# Patient Record
Sex: Male | Born: 1960
Health system: Southern US, Community
[De-identification: ages and names within clinical notes are randomized; demographics above are authoritative.]

## PROBLEM LIST (undated history)

## (undated) DIAGNOSIS — D693 Immune thrombocytopenic purpura: Secondary | ICD-10-CM

## (undated) DIAGNOSIS — T7840XA Allergy, unspecified, initial encounter: Secondary | ICD-10-CM

## (undated) DIAGNOSIS — U071 COVID-19: Secondary | ICD-10-CM

## (undated) DIAGNOSIS — I10 Essential (primary) hypertension: Secondary | ICD-10-CM

## (undated) DIAGNOSIS — K760 Fatty (change of) liver, not elsewhere classified: Secondary | ICD-10-CM

## (undated) DIAGNOSIS — J301 Allergic rhinitis due to pollen: Secondary | ICD-10-CM

## (undated) DIAGNOSIS — E785 Hyperlipidemia, unspecified: Secondary | ICD-10-CM

## (undated) HISTORY — DX: Hyperlipidemia, unspecified: E78.5

## (undated) HISTORY — DX: COVID-19: U07.1

## (undated) HISTORY — PX: NASAL SINUS SURGERY: SHX719

## (undated) HISTORY — DX: Essential (primary) hypertension: I10

## (undated) HISTORY — DX: Allergic rhinitis due to pollen: J30.1

## (undated) HISTORY — DX: Allergy, unspecified, initial encounter: T78.40XA

## (undated) HISTORY — DX: Immune thrombocytopenic purpura: D69.3

## (undated) HISTORY — DX: Fatty (change of) liver, not elsewhere classified: K76.0

---

## 2004-07-14 ENCOUNTER — Ambulatory Visit: Payer: Self-pay | Admitting: Family Medicine

## 2004-08-10 ENCOUNTER — Ambulatory Visit: Payer: Self-pay | Admitting: Internal Medicine

## 2004-11-08 ENCOUNTER — Ambulatory Visit: Payer: Self-pay | Admitting: Internal Medicine

## 2005-05-09 ENCOUNTER — Ambulatory Visit: Payer: Self-pay | Admitting: Family Medicine

## 2005-06-27 ENCOUNTER — Ambulatory Visit: Payer: Self-pay | Admitting: Family Medicine

## 2005-08-03 ENCOUNTER — Ambulatory Visit: Payer: Self-pay | Admitting: Internal Medicine

## 2005-12-01 ENCOUNTER — Ambulatory Visit: Payer: Self-pay | Admitting: Internal Medicine

## 2008-10-31 ENCOUNTER — Encounter: Payer: Self-pay | Admitting: Family Medicine

## 2009-02-05 ENCOUNTER — Ambulatory Visit: Payer: Self-pay | Admitting: Family Medicine

## 2009-02-05 DIAGNOSIS — I10 Essential (primary) hypertension: Secondary | ICD-10-CM | POA: Insufficient documentation

## 2009-02-10 ENCOUNTER — Telehealth (INDEPENDENT_AMBULATORY_CARE_PROVIDER_SITE_OTHER): Payer: Self-pay | Admitting: Internal Medicine

## 2009-02-11 ENCOUNTER — Telehealth: Payer: Self-pay | Admitting: Family Medicine

## 2009-02-20 ENCOUNTER — Ambulatory Visit: Payer: Self-pay | Admitting: Family Medicine

## 2009-05-01 ENCOUNTER — Ambulatory Visit: Payer: Self-pay | Admitting: Family Medicine

## 2009-05-04 LAB — CONVERTED CEMR LAB
AST: 18 units/L (ref 0–37)
Alkaline Phosphatase: 48 units/L (ref 39–117)
Creatinine, Ser: 1 mg/dL (ref 0.4–1.5)
GFR calc non Af Amer: 85.08 mL/min (ref >60)
Glucose, Bld: 94 mg/dL (ref 70–99)
Potassium: 3.8 meq/L (ref 3.5–5.1)
Sodium: 141 meq/L (ref 135–145)
VLDL: 14.6 mg/dL (ref 0.0–40.0)

## 2009-05-06 ENCOUNTER — Encounter: Payer: Self-pay | Admitting: Family Medicine

## 2009-05-08 ENCOUNTER — Ambulatory Visit: Payer: Self-pay | Admitting: Family Medicine

## 2009-06-02 ENCOUNTER — Ambulatory Visit: Payer: Self-pay | Admitting: Cardiology

## 2009-07-20 ENCOUNTER — Ambulatory Visit: Payer: Self-pay

## 2009-07-24 ENCOUNTER — Ambulatory Visit: Payer: Self-pay | Admitting: Unknown Physician Specialty

## 2009-07-29 ENCOUNTER — Ambulatory Visit: Payer: Self-pay | Admitting: Unknown Physician Specialty

## 2009-08-31 ENCOUNTER — Ambulatory Visit: Payer: Self-pay | Admitting: Family Medicine

## 2009-08-31 ENCOUNTER — Telehealth: Payer: Self-pay | Admitting: Family Medicine

## 2009-09-01 ENCOUNTER — Other Ambulatory Visit: Payer: Self-pay

## 2009-09-01 ENCOUNTER — Other Ambulatory Visit: Payer: Self-pay | Admitting: Emergency Medicine

## 2009-09-01 ENCOUNTER — Ambulatory Visit: Payer: Self-pay | Admitting: Hematology and Oncology

## 2009-09-01 ENCOUNTER — Telehealth (INDEPENDENT_AMBULATORY_CARE_PROVIDER_SITE_OTHER): Payer: Self-pay | Admitting: *Deleted

## 2009-09-01 LAB — CONVERTED CEMR LAB
Basophils Relative: 0.7 % (ref 0.0–3.0)
Eosinophils Absolute: 0.2 10*3/uL (ref 0.0–0.7)
Eosinophils Relative: 2.7 % (ref 0.0–5.0)
HCT: 39.9 % (ref 39.0–52.0)
Lymphocytes Relative: 28.4 % (ref 12.0–46.0)
Lymphs Abs: 1.6 10*3/uL (ref 0.7–4.0)
Neutro Abs: 3.6 10*3/uL (ref 1.4–7.7)
Neutrophils Relative %: 64 % (ref 43.0–77.0)
Platelets: 3 10*3/uL — CL (ref 150.0–400.0)
Prothrombin Time: 12.5 s — ABNORMAL HIGH (ref 9.1–11.7)
RBC: 4.3 M/uL (ref 4.22–5.81)
RDW: 12.8 % (ref 11.5–14.6)
aPTT: 29.1 s — ABNORMAL HIGH (ref 21.7–28.8)

## 2009-09-02 ENCOUNTER — Encounter (INDEPENDENT_AMBULATORY_CARE_PROVIDER_SITE_OTHER): Payer: Self-pay | Admitting: Internal Medicine

## 2009-09-03 ENCOUNTER — Ambulatory Visit: Payer: Self-pay | Admitting: Hematology and Oncology

## 2009-09-04 DIAGNOSIS — D696 Thrombocytopenia, unspecified: Secondary | ICD-10-CM | POA: Insufficient documentation

## 2009-09-08 ENCOUNTER — Encounter: Payer: Self-pay | Admitting: Family Medicine

## 2009-09-08 LAB — TECHNOLOGIST REVIEW: Technologist Review: 2

## 2009-09-08 LAB — COMPREHENSIVE METABOLIC PANEL
ALT: 48 U/L (ref 0–53)
Alkaline Phosphatase: 50 U/L (ref 39–117)
BUN: 15 mg/dL (ref 6–23)
CO2: 31 mEq/L (ref 19–32)
Calcium: 9.6 mg/dL (ref 8.4–10.5)

## 2009-09-08 LAB — CBC WITH DIFFERENTIAL/PLATELET
BASO%: 0.1 % (ref 0.0–2.0)
EOS%: 0 % (ref 0.0–7.0)
HCT: 41.6 % (ref 38.4–49.9)
HGB: 14.4 g/dL (ref 13.0–17.1)
LYMPH%: 8.8 % — ABNORMAL LOW (ref 14.0–49.0)
MONO#: 0.1 10*3/uL (ref 0.1–0.9)
MONO%: 1.7 % (ref 0.0–14.0)
NEUT#: 6.5 10*3/uL (ref 1.5–6.5)
RBC: 4.48 10*6/uL (ref 4.20–5.82)
lymph#: 0.6 10*3/uL — ABNORMAL LOW (ref 0.9–3.3)

## 2009-09-15 LAB — CBC WITH DIFFERENTIAL/PLATELET
EOS%: 0.4 % (ref 0.0–7.0)
Eosinophils Absolute: 0 10*3/uL (ref 0.0–0.5)
HCT: 45.7 % (ref 38.4–49.9)
MCH: 30.7 pg (ref 27.2–33.4)
MCHC: 33.7 g/dL (ref 32.0–36.0)
MONO#: 0.7 10*3/uL (ref 0.1–0.9)
NEUT%: 58.8 % (ref 39.0–75.0)
Platelets: 15 10*3/uL — ABNORMAL LOW (ref 140–400)
RBC: 5.01 10*6/uL (ref 4.20–5.82)

## 2009-09-16 LAB — CBC WITH DIFFERENTIAL/PLATELET
BASO%: 0 % (ref 0.0–2.0)
Basophils Absolute: 0 10*3/uL (ref 0.0–0.1)
Eosinophils Absolute: 0 10*3/uL (ref 0.0–0.5)
LYMPH%: 8.8 % — ABNORMAL LOW (ref 14.0–49.0)
MCH: 32.2 pg (ref 27.2–33.4)
MCHC: 34.6 g/dL (ref 32.0–36.0)
MCV: 92.9 fL (ref 79.3–98.0)
MONO#: 0.3 10*3/uL (ref 0.1–0.9)
MONO%: 2.8 % (ref 0.0–14.0)
NEUT#: 8.4 10*3/uL — ABNORMAL HIGH (ref 1.5–6.5)
RBC: 4.84 10*6/uL (ref 4.20–5.82)
RDW: 13.4 % (ref 11.0–14.6)
WBC: 9.5 10*3/uL (ref 4.0–10.3)
lymph#: 0.8 10*3/uL — ABNORMAL LOW (ref 0.9–3.3)

## 2009-09-18 LAB — CBC WITH DIFFERENTIAL/PLATELET
BASO%: 0 % (ref 0.0–2.0)
EOS%: 0 % (ref 0.0–7.0)
Eosinophils Absolute: 0 10*3/uL (ref 0.0–0.5)
MCH: 31.7 pg (ref 27.2–33.4)
MCHC: 34 g/dL (ref 32.0–36.0)
MONO#: 0.1 10*3/uL (ref 0.1–0.9)
MONO%: 1.2 % (ref 0.0–14.0)
NEUT%: 94.5 % — ABNORMAL HIGH (ref 39.0–75.0)
Platelets: 36 10*3/uL — ABNORMAL LOW (ref 140–400)
RBC: 4.88 10*6/uL (ref 4.20–5.82)
RDW: 13.6 % (ref 11.0–14.6)
WBC: 10.5 10*3/uL — ABNORMAL HIGH (ref 4.0–10.3)

## 2009-09-18 LAB — HIV ANTIBODY (ROUTINE TESTING W REFLEX)

## 2009-09-23 ENCOUNTER — Encounter: Payer: Self-pay | Admitting: Family Medicine

## 2009-09-23 LAB — CBC WITH DIFFERENTIAL/PLATELET
Basophils Absolute: 0 10*3/uL (ref 0.0–0.1)
Eosinophils Absolute: 0 10*3/uL (ref 0.0–0.5)
NEUT%: 84.4 % — ABNORMAL HIGH (ref 39.0–75.0)
Platelets: 47 10*3/uL — ABNORMAL LOW (ref 140–400)
RBC: 4.89 10*6/uL (ref 4.20–5.82)
RDW: 13.2 % (ref 11.0–14.6)
lymph#: 1.3 10*3/uL (ref 0.9–3.3)

## 2009-10-02 ENCOUNTER — Ambulatory Visit: Payer: Self-pay | Admitting: Family Medicine

## 2009-10-02 DIAGNOSIS — M79609 Pain in unspecified limb: Secondary | ICD-10-CM | POA: Insufficient documentation

## 2009-10-02 LAB — COMPREHENSIVE METABOLIC PANEL
AST: 64 U/L — ABNORMAL HIGH (ref 0–37)
Albumin: 3.7 g/dL (ref 3.5–5.2)
BUN: 21 mg/dL (ref 6–23)
CO2: 27 mEq/L (ref 19–32)
Chloride: 101 mEq/L (ref 96–112)
Glucose, Bld: 114 mg/dL — ABNORMAL HIGH (ref 70–99)
Potassium: 3.5 mEq/L (ref 3.5–5.3)
Total Bilirubin: 2.5 mg/dL — ABNORMAL HIGH (ref 0.3–1.2)

## 2009-10-02 LAB — CBC WITH DIFFERENTIAL/PLATELET
BASO%: 0.2 % (ref 0.0–2.0)
Basophils Absolute: 0 10*3/uL (ref 0.0–0.1)
EOS%: 0.3 % (ref 0.0–7.0)
LYMPH%: 29 % (ref 14.0–49.0)
MCHC: 35.9 g/dL (ref 32.0–36.0)
MCV: 93.3 fL (ref 79.3–98.0)
MONO#: 0.6 10*3/uL (ref 0.1–0.9)
lymph#: 3.7 10*3/uL — ABNORMAL HIGH (ref 0.9–3.3)

## 2009-10-13 ENCOUNTER — Ambulatory Visit: Payer: Self-pay | Admitting: Hematology and Oncology

## 2009-10-14 ENCOUNTER — Encounter: Payer: Self-pay | Admitting: Family Medicine

## 2009-10-14 LAB — CBC WITH DIFFERENTIAL/PLATELET
BASO%: 0.1 % (ref 0.0–2.0)
HCT: 36.1 % — ABNORMAL LOW (ref 38.4–49.9)
HGB: 12.4 g/dL — ABNORMAL LOW (ref 13.0–17.1)
MONO%: 1.2 % (ref 0.0–14.0)
NEUT#: 6.5 10*3/uL (ref 1.5–6.5)
NEUT%: 92.6 % — ABNORMAL HIGH (ref 39.0–75.0)
RDW: 20.4 % — ABNORMAL HIGH (ref 11.0–14.6)
WBC: 7.1 10*3/uL (ref 4.0–10.3)

## 2009-10-27 LAB — CBC WITH DIFFERENTIAL/PLATELET
BASO%: 0.3 % (ref 0.0–2.0)
Basophils Absolute: 0 10*3/uL (ref 0.0–0.1)
Eosinophils Absolute: 0 10*3/uL (ref 0.0–0.5)
LYMPH%: 33.7 % (ref 14.0–49.0)
MCV: 98.8 fL — ABNORMAL HIGH (ref 79.3–98.0)
MONO#: 0.6 10*3/uL (ref 0.1–0.9)
MONO%: 9.4 % (ref 0.0–14.0)
NEUT#: 3.7 10*3/uL (ref 1.5–6.5)
WBC: 6.6 10*3/uL (ref 4.0–10.3)

## 2009-11-05 LAB — CBC WITH DIFFERENTIAL/PLATELET
Basophils Absolute: 0 10*3/uL (ref 0.0–0.1)
Eosinophils Absolute: 0 10*3/uL (ref 0.0–0.5)
LYMPH%: 27.2 % (ref 14.0–49.0)
MCHC: 34.5 g/dL (ref 32.0–36.0)
NEUT#: 5.1 10*3/uL (ref 1.5–6.5)
NEUT%: 64.1 % (ref 39.0–75.0)

## 2009-11-18 ENCOUNTER — Ambulatory Visit: Payer: Self-pay | Admitting: Hematology and Oncology

## 2009-11-19 ENCOUNTER — Encounter: Payer: Self-pay | Admitting: Family Medicine

## 2009-11-19 LAB — CBC WITH DIFFERENTIAL/PLATELET
BASO%: 0.2 % (ref 0.0–2.0)
Basophils Absolute: 0 10*3/uL (ref 0.0–0.1)
EOS%: 0.5 % (ref 0.0–7.0)
HCT: 41.3 % (ref 38.4–49.9)
HGB: 14.4 g/dL (ref 13.0–17.1)
MCHC: 34.8 g/dL (ref 32.0–36.0)
MONO#: 0.4 10*3/uL (ref 0.1–0.9)
RBC: 4.28 10*6/uL (ref 4.20–5.82)
lymph#: 1.6 10*3/uL (ref 0.9–3.3)

## 2009-11-19 LAB — BASIC METABOLIC PANEL
Calcium: 9.2 mg/dL (ref 8.4–10.5)
Chloride: 103 mEq/L (ref 96–112)
Creatinine, Ser: 1.13 mg/dL (ref 0.40–1.50)
Potassium: 3.5 mEq/L (ref 3.5–5.3)

## 2009-11-25 ENCOUNTER — Ambulatory Visit: Payer: Self-pay | Admitting: Family Medicine

## 2009-11-25 ENCOUNTER — Telehealth: Payer: Self-pay | Admitting: Family Medicine

## 2009-11-25 DIAGNOSIS — S61209A Unspecified open wound of unspecified finger without damage to nail, initial encounter: Secondary | ICD-10-CM | POA: Insufficient documentation

## 2009-12-08 LAB — CBC WITH DIFFERENTIAL/PLATELET
BASO%: 0.4 % (ref 0.0–2.0)
EOS%: 0.2 % (ref 0.0–7.0)
HGB: 13.6 g/dL (ref 13.0–17.1)
LYMPH%: 25.9 % (ref 14.0–49.0)
MCH: 32.9 pg (ref 27.2–33.4)
MCHC: 34.7 g/dL (ref 32.0–36.0)
MONO#: 0.6 10*3/uL (ref 0.1–0.9)
NEUT#: 4.8 10*3/uL (ref 1.5–6.5)
NEUT%: 65.9 % (ref 39.0–75.0)
RBC: 4.14 10*6/uL — ABNORMAL LOW (ref 4.20–5.82)

## 2009-12-25 ENCOUNTER — Ambulatory Visit: Payer: Self-pay | Admitting: Hematology and Oncology

## 2009-12-29 ENCOUNTER — Encounter: Payer: Self-pay | Admitting: Family Medicine

## 2009-12-29 LAB — CBC WITH DIFFERENTIAL/PLATELET
BASO%: 0.4 % (ref 0.0–2.0)
EOS%: 0.7 % (ref 0.0–7.0)
HCT: 41.3 % (ref 38.4–49.9)
LYMPH%: 32.6 % (ref 14.0–49.0)
MONO#: 0.6 10*3/uL (ref 0.1–0.9)
MONO%: 7.9 % (ref 0.0–14.0)
Platelets: 140 10*3/uL (ref 140–400)
RBC: 4.41 10*6/uL (ref 4.20–5.82)
RDW: 13.6 % (ref 11.0–14.6)
lymph#: 2.6 10*3/uL (ref 0.9–3.3)

## 2010-01-11 LAB — CBC WITH DIFFERENTIAL/PLATELET
Basophils Absolute: 0 10*3/uL (ref 0.0–0.1)
EOS%: 0 % (ref 0.0–7.0)
Eosinophils Absolute: 0 10*3/uL (ref 0.0–0.5)
LYMPH%: 12.5 % — ABNORMAL LOW (ref 14.0–49.0)
MCH: 32.3 pg (ref 27.2–33.4)
MONO%: 1.8 % (ref 0.0–14.0)
NEUT#: 5.8 10*3/uL (ref 1.5–6.5)
NEUT%: 85.7 % — ABNORMAL HIGH (ref 39.0–75.0)
RDW: 13 % (ref 11.0–14.6)
WBC: 6.7 10*3/uL (ref 4.0–10.3)
lymph#: 0.8 10*3/uL — ABNORMAL LOW (ref 0.9–3.3)

## 2010-01-19 LAB — CBC WITH DIFFERENTIAL/PLATELET
BASO%: 0.6 % (ref 0.0–2.0)
Eosinophils Absolute: 0.1 10*3/uL (ref 0.0–0.5)
HGB: 14.5 g/dL (ref 13.0–17.1)
MCH: 31.8 pg (ref 27.2–33.4)
MONO#: 0.6 10*3/uL (ref 0.1–0.9)
WBC: 6 10*3/uL (ref 4.0–10.3)

## 2010-01-25 ENCOUNTER — Ambulatory Visit: Payer: Self-pay | Admitting: Internal Medicine

## 2010-02-01 ENCOUNTER — Ambulatory Visit: Payer: Self-pay | Admitting: Hematology and Oncology

## 2010-02-05 ENCOUNTER — Encounter: Payer: Self-pay | Admitting: Family Medicine

## 2010-02-05 LAB — COMPREHENSIVE METABOLIC PANEL
AST: 14 U/L (ref 0–37)
BUN: 17 mg/dL (ref 6–23)
CO2: 26 mEq/L (ref 19–32)
Calcium: 9.4 mg/dL (ref 8.4–10.5)
Glucose, Bld: 91 mg/dL (ref 70–99)
Total Bilirubin: 0.8 mg/dL (ref 0.3–1.2)
Total Protein: 6.5 g/dL (ref 6.0–8.3)

## 2010-02-05 LAB — CBC WITH DIFFERENTIAL/PLATELET
BASO%: 0.7 % (ref 0.0–2.0)
Basophils Absolute: 0 10*3/uL (ref 0.0–0.1)
HCT: 42.2 % (ref 38.4–49.9)
HGB: 14.6 g/dL (ref 13.0–17.1)
MCH: 31.3 pg (ref 27.2–33.4)
MCV: 90.3 fL (ref 79.3–98.0)
MONO%: 10.9 % (ref 0.0–14.0)
NEUT#: 3 10*3/uL (ref 1.5–6.5)
NEUT%: 54.1 % (ref 39.0–75.0)
Platelets: 165 10*3/uL (ref 140–400)
RBC: 4.67 10*6/uL (ref 4.20–5.82)
RDW: 13.3 % (ref 11.0–14.6)

## 2010-04-20 ENCOUNTER — Ambulatory Visit: Payer: Self-pay | Admitting: Hematology and Oncology

## 2010-05-25 ENCOUNTER — Ambulatory Visit: Payer: Self-pay | Admitting: Hematology and Oncology

## 2010-05-27 ENCOUNTER — Inpatient Hospital Stay (HOSPITAL_COMMUNITY): Admission: EM | Admit: 2010-05-27 | Discharge: 2009-09-03 | Payer: Self-pay | Admitting: Internal Medicine

## 2010-05-27 ENCOUNTER — Encounter: Payer: Self-pay | Admitting: Family Medicine

## 2010-05-27 LAB — COMPREHENSIVE METABOLIC PANEL
ALT: 25 U/L (ref 0–53)
Albumin: 4.6 g/dL (ref 3.5–5.2)
Alkaline Phosphatase: 45 U/L (ref 39–117)
BUN: 17 mg/dL (ref 6–23)
Calcium: 9.7 mg/dL (ref 8.4–10.5)
Chloride: 105 mEq/L (ref 96–112)
Creatinine, Ser: 1.06 mg/dL (ref 0.40–1.50)
Total Protein: 6.5 g/dL (ref 6.0–8.3)

## 2010-05-27 LAB — CBC WITH DIFFERENTIAL/PLATELET
Eosinophils Absolute: 0.1 10*3/uL (ref 0.0–0.5)
MCH: 31.7 pg (ref 27.2–33.4)
MONO#: 0.4 10*3/uL (ref 0.1–0.9)
RBC: 4.75 10*6/uL (ref 4.20–5.82)
RDW: 12.8 % (ref 11.0–14.6)
lymph#: 1.4 10*3/uL (ref 0.9–3.3)

## 2010-06-08 ENCOUNTER — Ambulatory Visit: Payer: Self-pay | Admitting: Internal Medicine

## 2010-07-20 NOTE — Letter (Signed)
Summary: [facility]   Imported By: Lanelle Bal 07/06/2009 09:26:08  _____________________________________________________________________  External Attachment:    Type:   Image     Comment:   External Document

## 2010-07-20 NOTE — Assessment & Plan Note (Signed)
Summary: FOOT INJURY/DLO   Vital Signs:  Patient profile:   50 year old male Weight:      215 pounds Temp:     98.2 degrees F oral Pulse rate:   76 / minute Pulse rhythm:   regular BP sitting:   140 / 94  (left arm) Cuff size:   regular  Vitals Entered By: Lowella Petties CMA (October 02, 2009 12:47 PM) CC: Left foot pain, injured that foot 4 or 5 years ago.   History of Present Illness: Recent diagnosis onf ITP. ..finally improved with IVIG on prednisone. Sees Dr. Welton Flakes. Platelets today 230.   5-6 years ago... began having pain in left foot. saw MD referred to podiatrist. No injury Given cortisone shot..had resulting divit from steroid injection.  Symptoms of pain resolved until 4 days ago pain recurred.  Pain over dorsal foot on left.  No redness, no swelling.  Pain with weight bearing, but able to weight bear. Mild pain with dorsiflexion.  Has not been working out in last 8 weeks due to ITP.   BPs have been trending up...? due to stress steroids. On losartan.   Problems Prior to Update: 1)  Thrombocytopenia  (ICD-287.5) 2)  Contusion Multiple Sites Shoulder&upper Arm  (ICD-923.09) 3)  Sinusitis, Chronic  (ICD-473.9) 4)  Screening For Lipoid Disorders  (ICD-V77.91) 5)  Pharyngitis  (ICD-462) 6)  Hypertension  (ICD-401.9) 7)  Allergic Rhinitis  (ICD-477.9) 8)  Uri  (ICD-465.9)  Current Medications (verified): 1)  Losartan Potassium 100 Mg Tabs (Losartan Potassium) .... Take 1 Tablet By Mouth Once A Day 2)  Vitamin C Cr 500 Mg Cr-Tabs (Ascorbic Acid) .... Once Daily 3)  Ultimate Man .... Once Daily 4)  Prednisone 10 Mg Tabs (Prednisone) .... Take 9 Tablets By Mouth Daily  Allergies (verified): No Known Drug Allergies  Past History:  Past medical, surgical, family and social histories (including risk factors) reviewed, and no changes noted (except as noted below).  Past Medical History: Reviewed history from 08/31/2009 and no changes required. HYPERTENSION  (ICD-401.9) ALLERGIC RHINITIS (ICD-477.9)  Family History: Reviewed history from 08/31/2009 and no changes required. no clotting disorders or heme dysfunction  Social History: Reviewed history from 08/31/2009 and no changes required. 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 CV:  Denies chest pain or discomfort. Resp:  Denies shortness of breath. GI:  Denies abdominal pain. GU:  Denies dysuria.  Physical Exam  General:  Well-developed,well-nourished,in no acute distress; alert,appropriate and cooperative throughout examination Mouth:  Oral mucosa and oropharynx without lesions or exudates.  Teeth in good repair. Neck:  no carotid bruit or thyromegaly no cervical or supraclavicular lymphadenopathy  Lungs:  Normal respiratory effort, chest expands symmetrically. Lungs are clear to auscultation, no crackles or wheezes. Heart:  Normal rate and regular rhythm. S1 and S2 normal without gallop, murmur, click, rub or other extra sounds. Abdomen:  Bowel sounds positive,abdomen soft and non-tender without masses, organomegaly or hernias noted. Msk:  ttp over central dorsal foot, no redness, no swelling, full ROM, no ankle pain or swelling Pulses:  R and L posterior tibial pulses are full and equal bilaterally  Extremities:  no edema Neurologic:  No cranial nerve deficits noted. Station and gait are normal. DTRs are symmetrical throughout. Sensory, motor and coordinative functions appear intact.   Impression & Recommendations:  Problem # 1:  FOOT PAIN, LEFT (ICD-729.5) Treat with NSAIDs, stretching and  ICe. Elevate above heart. Follow up if not improving in 2 weeks.   Problem # 2:  HYPERTENSION (ICD-401.9) Follow closely at home and MD appts. if remains elevated depite weaing of prednisone. Call to make medicaiton changes.  His updated medication list for this problem  includes:    Losartan Potassium 100 Mg Tabs (Losartan potassium) .Marland Kitchen... Take 1 tablet by mouth once a day  Problem # 3:  THROMBOCYTOPENIA (ICD-287.5) ITP improveing per Dr. Welton Flakes. reviewed last OV notes.   Complete Medication List: 1)  Losartan Potassium 100 Mg Tabs (Losartan potassium) .... Take 1 tablet by mouth once a day 2)  Vitamin C Cr 500 Mg Cr-tabs (Ascorbic acid) .... Once daily 3)  Ultimate Man  .... Once daily 4)  Prednisone 10 Mg Tabs (Prednisone) .... Take 9 tablets by mouth daily  Patient Instructions: 1)  Ice, elevate, tylenol as needed pain. gentle stretching exercises 2-3 times daily.   Prior Medications (reviewed today): LOSARTAN POTASSIUM 100 MG TABS (LOSARTAN POTASSIUM) Take 1 tablet by mouth once a day VITAMIN C CR 500 MG CR-TABS (ASCORBIC ACID) once daily ULTIMATE MAN () once daily PREDNISONE 10 MG TABS (PREDNISONE) take 9 tablets by mouth daily Current Allergies (reviewed today): No known allergies

## 2010-07-20 NOTE — Letter (Signed)
[facility]   Imported By: Maryln Gottron 10/13/2009 15:22:30  _____________________________________________________________________  External Attachment:    Type:   Image     Comment:   External Document

## 2010-07-20 NOTE — Assessment & Plan Note (Signed)
Summary: congestion/alc   Vital Signs:  Patient profile:   50 year old male Weight:      232.25 pounds Temp:     98.3 degrees F oral Pulse rate:   60 / minute Pulse rhythm:   regular BP sitting:   132 / 90  (left arm) Cuff size:   large  Vitals Entered By: Selena Batten Dance CMA Duncan Dull) (January 25, 2010 11:22 AM) CC: Cough/congestion   History of Present Illness: CC: congestion  Tuesday started feeling congested, wet sounding cough.  subjective fevers but no actual temp, no nausea/vomiting.  + diarrhea.  No ST, rhinorrhea, HA, sinus pressure.  + hoarse.  + children with similar cough.  Went to [facility] over weekend, stayed in [place] because didn't feel well enough to go out.  Increased muscle aches and fatigue.  Went last week to [facility], given course of keflex for 7 days, didn't note improvement.  Sinus surgery 08/2009.  ITP 08/2009, on chronic prednisone 90mg  daily.  plt count stays around 150.  Preventive Screening-Counseling & Management  Alcohol-Tobacco     Smoking Status: never  Current Medications (verified): 1)  Losartan Potassium 100 Mg Tabs (Losartan Potassium) .... Take 1 Tablet By Mouth Once A Day 2)  Vitamin C Cr 500 Mg Cr-Tabs (Ascorbic Acid) .... Once Daily 3)  Ultimate Man .... Once Daily 4)  Prednisone 10 Mg Tabs (Prednisone) .... Take 9 Tablets By Mouth Daily  Allergies (verified): No Known Drug Allergies  Past History:  Past Medical History: HYPERTENSION (ICD-401.9) ALLERGIC RHINITIS (ICD-477.9) ITP on chronic prednisone  Past Surgical History: s/p sinus surgery 08/2009, no more sinus issues since  Social History: Smoking Status:  never  Review of Systems       per HPI  Physical Exam  General:  Well-developed,well-nourished,in no acute distress; alert,appropriate and cooperative throughout examination Head:  Normocephalic and atraumatic without obvious abnormalities. No apparent alopecia or balding. Eyes:  No corneal or conjunctival  inflammation noted. EOMI. Perrla.  Ears:  External ear exam shows no significant lesions or deformities.  Otoscopic examination reveals clear canals, tympanic membranes are intact bilaterally without bulging, retraction, inflammation or discharge. Hearing is grossly normal bilaterally. Nose:  swollen turbinates Mouth:  Oral mucosa and oropharynx without lesions or exudates.  Teeth in good repair. Neck:  No deformities, masses, or tenderness noted. Lungs:  Normal respiratory effort, chest expands symmetrically. Lungs are clear to auscultation, no crackles or wheezes. Heart:  Normal rate and regular rhythm. S1 and S2 normal without gallop, murmur, click, rub or other extra sounds. Extremities:  no edema Cervical Nodes:  No lymphadenopathy noted   Impression & Recommendations:  Problem # 1:  COUGH (ICD-786.2) 1wk h/o wet sounding but not productive cough, did not improve with 1wk course of keflex by ENT.  + sick contacts.  Likely post-viral cough.  treat with expectorant IR x next week.  Pt with h/o ITP on chronic steroids (prednisone 90mg  daily) so immunocompromised.  Given only going on for 1 wk and no fevers, doubt CXR would be informative.  treat supportively for now.  If not improving, RTC for re eval.  consider CXR then.  lungs clear on exam today.  Complete Medication List: 1)  Losartan Potassium 100 Mg Tabs (Losartan potassium) .... Take 1 tablet by mouth once a day 2)  Vitamin C Cr 500 Mg Cr-tabs (Ascorbic acid) .... Once daily 3)  Ultimate Man  .... Once daily 4)  Prednisone 10 Mg Tabs (Prednisone) .... Take  9 tablets by mouth daily 5)  Fenesin Ir 400 Mg Tabs (Guaifenesin) .... Take one in am and one at noon  Patient Instructions: 1)  I think you have a post viral cough.  These can last several weeks before going away. 2)  Take medicine as prescribed.  Plenty of rest and fluids. 3)  Reasons to return - high fevers >101.5, cough worsening instead of improving, trouble breathing or  wheezing,  4)  If it's been 1 1/2 wks and you are not improving, I want [place] to see you again.  Given you are on steroids, we need to keep a close eye on you. 5)  I hope you feel better.  Pleasure to meet you. Prescriptions: FENESIN IR 400 MG TABS (GUAIFENESIN) take one in am and one at noon  #30 x 0   Entered and Authorized by:   Eustaquio Boyden  MD   Signed by:   Eustaquio Boyden  MD on 01/25/2010   Method used:   Electronically to        [id number] (retail)       [address]       [place], [place]  [address]       Ph: [phone]       Fax: [phone]   RxID:   [id number]   Current Allergies (reviewed today): No known allergies   Appended Document: congestion/alc correction - pt actually on 30mg  prednisone not 90mg  (currently undergoing taper)

## 2010-07-20 NOTE — Progress Notes (Signed)
Summary: Bruises all over body  Phone Note Call from Patient Call back at [phone]   Caller: Spouse Call For: Dr. Patsy Lager Summary of Call: Patient's spouse called and wanted to get her husband in to be seen today if possible.  He had surgery a few weeks ago and now she says there are bruises popping up all over his body.  Noticied them about 4-5 days ago.  He is not in any pain, no other symptoms.  Please advise.   Initial call taken by: Linde Gillis CMA Duncan Dull),  August 31, 2009 11:17 AM  Follow-up for Phone Call        i am happy to see him, put on schedule. there are openings Follow-up by: Hannah Beat MD,  August 31, 2009 11:26 AM  Additional Follow-up for Phone Call Additional follow up Details #1::        Patient scheduled to see Dr. Patsy Lager today at 4:15, patient's spouse notified. Additional Follow-up by: Linde Gillis CMA Duncan Dull),  August 31, 2009 11:45 AM

## 2010-07-20 NOTE — Letter (Signed)
[facility]   Imported By: Maryln Gottron 09/28/2009 15:49:38  _____________________________________________________________________  External Attachment:    Type:   Image     Comment:   External Document

## 2010-07-20 NOTE — Letter (Signed)
[facility]   Imported By: Lanelle Bal 10/28/2009 10:02:25  _____________________________________________________________________  External Attachment:    Type:   Image     Comment:   External Document

## 2010-07-20 NOTE — Progress Notes (Signed)
Summary: Critical Lab Value  Phone Note From Other Clinic   Caller: Jacki Cones at [facility] For: Dr. Patsy Lager / Dr. Ermalene Searing Summary of Call: Critical Value:   Platelets  3.      She is going to look at the slide now.  Will call if anything changes. Initial call taken by: Delilah Shan CMA Duncan Dull),  September 01, 2009 2:21 PM  Follow-up for Phone Call        Platelet count of 3,000.  Emergent and needs to go to ER ASAP. I have called his cell phone, home, and his wife and have not been able to get in touch with anyone.   I have asked him to call our office ASAP. Please help assist and help him get to the ER and discuss if he calls. Dr. Ermalene Searing may need to speak to him to explain the importance. Follow-up by: Hannah Beat MD,  September 01, 2009 2:34 PM  Additional Follow-up for Phone Call Additional follow up Details #1::        Spoke with patient and also dr Ermalene Searing and patient is going to [facility] Additional Follow-up by: Benny Lennert CMA Duncan Dull),  September 01, 2009 2:53 PM     Appended Document: Critical Lab Value Discused low platelets in detail with pt. Discusssed possible causes. Pt agreed to be seen at [facility] for repeat cbc and possible platelet transfusion.  Spoke with charge nurse at [facility], faxed labs and last OV.  Appended Document: Critical Lab Value FYI: Pt transferred and admitted to [facility] for severe thrombocytopenia.

## 2010-07-20 NOTE — Assessment & Plan Note (Signed)
Summary: BRUISES ALL OVER BODY, PER DR. Reginald Mangels/NT   Vital Signs:  Patient profile:   50 year old male Height:      70 inches Weight:      223.2 pounds BMI:     32.14 Temp:     98.2 degrees F oral Pulse rate:   80 / minute Pulse rhythm:   regular BP sitting:   150 / 92  (left arm) Cuff size:   large  Vitals Entered By: Benny Lennert CMA Duncan Dull) (August 31, 2009 4:16 PM)  History of Present Illness: Chief complaint bruises all over body  50 year old gentleman with history of bruising over multiple body parts. After arms, some bruising on biceps and triceps. Some bruising on calves after calf day  No trauma no blood disorders personally or in family  Only new ingestion is a daily vitamin that does contain some plant extracts.  o/w healthy man   REVIEW OF SYSTEMS GEN: No acute illnesses, no fever, chills, sweats. CV: No chest pain or SOB GI: No noted N or V Otherwise, pertinent positives and negatives are noted in the HPI.    GEN: WDWN, NAD, Non-toxic, A & O x 3 HEENT: Atraumatic, Normocephalic. Neck supple. No masses, No LAD. Ears and Nose: No external deformity. CV: RRR, No M/G/R. No JVD. No thrill. No extra heart sounds. PULM: CTA B, no wheezes, crackles, rhonchi. No retractions. No resp. distress. No accessory muscle use. EXTR: No c/c/e NEURO: Normal gait.  PSYCH: Normally interactive. Conversant. Not depressed or anxious appearing.  Calm demeanor.   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  Past Medical History: HYPERTENSION (ICD-401.9) ALLERGIC RHINITIS (ICD-477.9)  Family History: Reviewed  history and no changes required. no clotting disorders or heme dysfunction  Social History: Reviewed history from 02/05/2009 and no changes required. 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 unclear. will pursue further work-up until some answers are found  Orders: Venipuncture (16109) TLB-CBC Platelet - w/Differential (85025-CBCD) TLB-PT (Protime) (85610-PTP) TLB-PTT (85730-PTTL)  Complete Medication List: 1)  Losartan Potassium 100 Mg Tabs (Losartan potassium) .... Take 1 tablet by mouth once a day 2)  Vitamin C Cr 500 Mg Cr-tabs (Ascorbic acid) .... Once daily 3)  Ultimate Man  .... Once daily  Current Allergies (reviewed today): No known allergies

## 2010-07-20 NOTE — Assessment & Plan Note (Signed)
Summary: 2:30 per AEB -Finger injury /lsf   Vital Signs:  Patient profile:   50 year old male Height:      70 inches Weight:      225 pounds BMI:     32.40 Temp:     98.0 degrees F oral Pulse rate:   80 / minute Pulse rhythm:   regular BP sitting:   140 / 70  (left arm) Cuff size:   regular  Vitals Entered By: Benny Lennert CMA Duncan Dull) (November 25, 2009 2:37 PM)  History of Present Illness: Chief complaint cut hand  2 days ago glass fell on left hand..laceration remains on finger, flap of skin present. Applied peroxide and OTC skin glue.  No redness...no swelling. Has stopped bleeding.   Problems Prior to Update: 1)  Foot Pain, Left  (ICD-729.5) 2)  Thrombocytopenia  (ICD-287.5) 3)  Screening For Lipoid Disorders  (ICD-V77.91) 4)  Hypertension  (ICD-401.9) 5)  Allergic Rhinitis  (ICD-477.9)  Current Medications (verified): 1)  Losartan Potassium 100 Mg Tabs (Losartan Potassium) .... Take 1 Tablet By Mouth Once A Day 2)  Vitamin C Cr 500 Mg Cr-Tabs (Ascorbic Acid) .... Once Daily 3)  Ultimate Man .... Once Daily 4)  Prednisone 10 Mg Tabs (Prednisone) .... Take 9 Tablets By Mouth Daily  Allergies (verified): No Known Drug Allergies  Past History:  Past medical, surgical, family and social histories (including risk factors) reviewed, and no changes noted (except as noted below).  Past Medical History: Reviewed history from 08/31/2009 and no changes required. HYPERTENSION (ICD-401.9) ALLERGIC RHINITIS (ICD-477.9)  Family History: Reviewed history from 08/31/2009 and no changes required. no clotting disorders or heme dysfunction  Social History: Reviewed history from 08/31/2009 and no changes required. 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 CV:  Denies chest pain or discomfort. Resp:  Denies shortness of breath.  Physical Exam  General:   Well-developed,well-nourished,in no acute distress; alert,appropriate and cooperative throughout examination Lungs:  Normal respiratory effort, chest expands symmetrically. Lungs are clear to auscultation, no crackles or wheezes. Heart:  Normal rate and regular rhythm. S1 and S2 normal without gallop, murmur, click, rub or other extra sounds. Skin:  V shaped laceration 1 cm  mid 2nd digit on left hand with flap of skin partially healed and well aproximated except left edge with 1-2 mm distance between skin edges. no surrounding erythema or heat.    Impression & Recommendations:  Problem # 1:  LACERATION, FINGER (ICD-883.0)  Area irrigated and cleaned with iodine. Dermabond applied and wound edges approximated and stabilized with steristrips. Pt to keep area clean with warm soapy water and watch for signs of infeciton such as fever, redness, pain.   Orders: Lacerat Simple STE 0 - 2.5 cm (12001)  Problem # 2:  THROMBOCYTOPENIA (ICD-287.5)  Hemostatis acheived.   Orders: Lacerat Simple STE 0 - 2.5 cm (12001)  Complete Medication List: 1)  Losartan Potassium 100 Mg Tabs (Losartan potassium) .... Take 1 tablet by mouth once a day 2)  Vitamin C Cr 500 Mg Cr-tabs (Ascorbic acid) .... Once daily 3)  Ultimate Man  .... Once daily 4)  Prednisone 10 Mg Tabs (Prednisone) .... Take 9 tablets by mouth daily  Current Allergies (reviewed today): No known allergies

## 2010-07-20 NOTE — Letter (Signed)
[facility]   Imported By: Lanelle Bal 09/22/2009 12:09:17  _____________________________________________________________________  External Attachment:    Type:   Image     Comment:   External Document  Appended Document: [facility] itp

## 2010-07-20 NOTE — Letter (Signed)
[facility]   Imported By: Sherian Rein 02/25/2010 11:26:57  _____________________________________________________________________  External Attachment:    Type:   Image     Comment:   External Document

## 2010-07-20 NOTE — Letter (Signed)
[facility]   Imported By: Lennie Odor 11/27/2009 15:32:37  _____________________________________________________________________  External Attachment:    Type:   Image     Comment:   External Document

## 2010-07-20 NOTE — Progress Notes (Signed)
Summary: Finger injury  Phone Note Call from Patient Call back at Home Phone [phone]   Caller: Patient Call For: Dr. Ermalene Searing Summary of Call: Patient states he sliced his finger open a few days ago and tried to handle it himself but now it is still bleeding.  He is asking if someone could take a look at it and if he has to go to the ER or UC, he will do that.  He says he just needs some direction.  He says he is in the area today and could be here within 15 minutes notice all day. Initial call taken by: Delilah Shan CMA Duncan Dull),  November 25, 2009 10:25 AM  Follow-up for Phone Call        add at 2:30 per bedsole Follow-up by: Benny Lennert CMA Duncan Dull),  November 25, 2009 10:28 AM  Additional Follow-up for Phone Call Additional follow up Details #1::        Patient Advised.   Added to schedule. Additional Follow-up by: Delilah Shan CMA Duncan Dull),  November 25, 2009 10:49 AM

## 2010-07-20 NOTE — Letter (Signed)
[facility]   Imported By: Lanelle Bal 01/05/2010 12:12:18  _____________________________________________________________________  External Attachment:    Type:   Image     Comment:   External Document

## 2010-07-22 NOTE — Assessment & Plan Note (Signed)
Summary: elevated blood pressure/alc   Vital Signs:  Patient profile:   50 year old male Weight:      235.50 pounds Temp:     98.5 degrees F oral Pulse rate:   64 / minute Pulse rhythm:   regular BP sitting:   110 / 80  (left arm) Cuff size:   large  Vitals Entered By: Selena Batten Dance CMA Duncan Dull) (June 08, 2010 3:35 PM) CC: Increased B/P   History of Present Illness: CC: bp up.  checks at home and readings recently done - (152/89, 150/81, 173/99, 148/103, 140/93)  bp up at oncologist's office - diastolic above 100.  On losartan 100mg  daily.  When bp up, feels flushed.  no HA, vision changes, chest pain, tightness, SOB, LE swelling.    vision check today by optometrist - good.  Off prenisone x 3 mo.  only change today - took bp med at lunch instead of breakfast as usual  had blood work done last week, thinks had Cr checked (will bring in at his convenience.)  Current Medications (verified): 1)  Losartan Potassium 100 Mg Tabs (Losartan Potassium) .... Take 1 Tablet By Mouth Once A Day 2)  Vitamin C Cr 500 Mg Cr-Tabs (Ascorbic Acid) .... Once Daily 3)  Ultimate Man .... 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  Teeth in good repair. Neck:  No deformities, masses, or  tenderness noted.  no bruits Lungs:  Normal respiratory effort, chest expands symmetrically. Lungs are clear to auscultation, no crackles or wheezes. Heart:  Normal rate and regular rhythm. S1 and S2 normal without gallop, murmur, click, rub or other extra sounds. Abdomen:  no AA/renal bruits Pulses:  2+ rad pulses Extremities:  no edema   Impression & Recommendations:  Problem # 1:  HYPERTENSION (ICD-401.9) checked bp cuff vs ours - about 5-10 mmHg above reading.  however, radings have been consistently high.  add HCTZ to regimen, return in 1 mo for f/u.  pt to change cuffs and keep track at home, if running too low, to call us.  f/u with PCP 1 mo.  states had blood work done last week.  asked him to bring by copy to check Cr.  His updated medication list for this problem includes:    Losartan Potassium-hctz 100-12.5 Mg Tabs (Losartan potassium-hctz) .Marland Kitchen... Take one daily for blood pressure  BP today: 110/80 Prior BP: 132/90 (01/25/2010)  Labs Reviewed: K+: 3.8 (05/01/2009) Creat: : 1.0 (05/01/2009)   Chol: 197 (05/01/2009)   HDL: 49.20 (05/01/2009)   LDL: 133 (05/01/2009)   TG: 73.0 (05/01/2009)  Complete Medication List: 1)  Losartan Potassium-hctz 100-12.5 Mg Tabs (Losartan potassium-hctz) .... Take one daily for blood pressure 2)  Vitamin C Cr 500 Mg Cr-tabs (Ascorbic acid) .... Once daily 3)  Ultimate Man  .... Once daily  Patient Instructions: 1)  Check blood pressure at least 30 min after eating, caffeine, alcohol, exercise,  or stress. 2)  We've checked calibration of cuff today 3)  Bring in copy of blood work at [facility] (to check kidney function). 4)  return in 1 month for follow up. Prescriptions: LOSARTAN POTASSIUM-HCTZ 100-12.5 MG TABS (LOSARTAN POTASSIUM-HCTZ) take one daily for blood pressure  #90 x 1   Entered and Authorized by:   Eustaquio Boyden  MD   Signed by:   Eustaquio Boyden  MD on 06/08/2010   Method used:   [facility] to        [id number]       [address]       [place], [place]  [address]       Ph: [phone]       Fax: [phone]   RxID:   [id number]    Orders Added: 1)  Est. Patient Level III [27253]    Current Allergies (reviewed today): No known allergies

## 2010-07-22 NOTE — Letter (Signed)
Summary: [facility]  [facility]   Imported By: Lester [place] 06/11/2010 08:23:05  _____________________________________________________________________  External Attachment:    Type:   Image     Comment:   External Document

## 2010-09-13 LAB — CBC
HCT: 42.4 % (ref 39.0–52.0)
HCT: 43.8 % (ref 39.0–52.0)
Hemoglobin: 13.1 g/dL (ref 13.0–17.0)
Hemoglobin: 14 g/dL (ref 13.0–17.0)
MCHC: 32.7 g/dL (ref 30.0–36.0)
MCHC: 33.1 g/dL (ref 30.0–36.0)
MCV: 93.9 fL (ref 78.0–100.0)
MCV: 94.4 fL (ref 78.0–100.0)
MCV: 95.3 fL (ref 78.0–100.0)
Platelets: 5 10*3/uL — CL (ref 150–400)
RBC: 4.26 MIL/uL (ref 4.22–5.81)
RBC: 4.45 MIL/uL (ref 4.22–5.81)
WBC: 5.5 10*3/uL (ref 4.0–10.5)

## 2010-09-13 LAB — DIFFERENTIAL
Basophils Absolute: 0 10*3/uL (ref 0.0–0.1)
Basophils Relative: 0 % (ref 0–1)
Basophils Relative: 0 % (ref 0–1)
Basophils Relative: 0 % (ref 0–1)
Eosinophils Absolute: 0.1 10*3/uL (ref 0.0–0.7)
Eosinophils Absolute: 0.1 10*3/uL (ref 0.0–0.7)
Eosinophils Relative: 0 % (ref 0–5)
Eosinophils Relative: 1 % (ref 0–5)
Lymphocytes Relative: 23 % (ref 12–46)
Lymphs Abs: 0.5 10*3/uL — ABNORMAL LOW (ref 0.7–4.0)
Lymphs Abs: 1.8 10*3/uL (ref 0.7–4.0)
Lymphs Abs: 1.9 10*3/uL (ref 0.7–4.0)
Monocytes Absolute: 0.2 10*3/uL (ref 0.1–1.0)
Monocytes Relative: 2 % — ABNORMAL LOW (ref 3–12)
Monocytes Relative: 7 % (ref 3–12)
Monocytes Relative: 8 % (ref 3–12)
Neutro Abs: 7.1 10*3/uL (ref 1.7–7.7)
Neutrophils Relative %: 56 % (ref 43–77)

## 2010-09-13 LAB — BASIC METABOLIC PANEL
CO2: 28 mEq/L (ref 19–32)
CO2: 28 mEq/L (ref 19–32)
Chloride: 103 mEq/L (ref 96–112)
Chloride: 108 mEq/L (ref 96–112)
Creatinine, Ser: 1.05 mg/dL (ref 0.4–1.5)
GFR calc Af Amer: >60 mL/min (ref >60)
GFR calc Af Amer: >60 mL/min (ref >60)
Potassium: 4.5 mEq/L (ref 3.5–5.1)
Sodium: 140 mEq/L (ref 135–145)

## 2010-09-13 LAB — PROTIME-INR: Prothrombin Time: 13 seconds (ref 11.6–15.2)

## 2010-11-23 ENCOUNTER — Encounter (HOSPITAL_BASED_OUTPATIENT_CLINIC_OR_DEPARTMENT_OTHER): Payer: BC Managed Care – PPO | Admitting: Hematology and Oncology

## 2010-11-23 ENCOUNTER — Other Ambulatory Visit: Payer: Self-pay | Admitting: Hematology and Oncology

## 2010-11-23 DIAGNOSIS — D693 Immune thrombocytopenic purpura: Secondary | ICD-10-CM

## 2010-11-23 LAB — CBC WITH DIFFERENTIAL/PLATELET
Basophils Absolute: 0 10*3/uL (ref 0.0–0.1)
EOS%: 1.2 % (ref 0.0–7.0)
Eosinophils Absolute: 0.1 10*3/uL (ref 0.0–0.5)
HCT: 41.9 % (ref 38.4–49.9)
HGB: 14.6 g/dL (ref 13.0–17.1)
MCH: 31.5 pg (ref 27.2–33.4)
MCV: 90.5 fL (ref 79.3–98.0)
MONO%: 5.2 % (ref 0.0–14.0)
NEUT#: 3.2 10*3/uL (ref 1.5–6.5)
NEUT%: 65.9 % (ref 39.0–75.0)
lymph#: 1.3 10*3/uL (ref 0.9–3.3)

## 2010-11-23 LAB — BASIC METABOLIC PANEL
BUN: 18 mg/dL (ref 6–23)
Chloride: 102 mEq/L (ref 96–112)
Creatinine, Ser: 1.13 mg/dL (ref 0.50–1.35)
Glucose, Bld: 123 mg/dL — ABNORMAL HIGH (ref 70–99)
Potassium: 3.7 mEq/L (ref 3.5–5.3)

## 2010-11-29 ENCOUNTER — Other Ambulatory Visit: Payer: Self-pay | Admitting: Family Medicine

## 2010-11-29 NOTE — Telephone Encounter (Signed)
I saw pt 06/08/2010, advised to f/u 1 mo with pcp, did not return.  Will route to PCP.

## 2010-11-29 NOTE — Telephone Encounter (Signed)
Needs appt for follow up. Okay to refill until then.

## 2010-12-07 ENCOUNTER — Encounter: Payer: Self-pay | Admitting: Family Medicine

## 2010-12-08 ENCOUNTER — Encounter: Payer: Self-pay | Admitting: Family Medicine

## 2010-12-08 ENCOUNTER — Ambulatory Visit (INDEPENDENT_AMBULATORY_CARE_PROVIDER_SITE_OTHER): Payer: BC Managed Care – PPO | Admitting: Family Medicine

## 2010-12-08 DIAGNOSIS — I10 Essential (primary) hypertension: Secondary | ICD-10-CM

## 2010-12-08 MED ORDER — LOSARTAN POTASSIUM-HCTZ 100-25 MG PO TABS: 1.0000 | ORAL_TABLET | Freq: Every day | ORAL | Status: DC

## 2010-12-08 NOTE — Progress Notes (Signed)
Jonathan Blackwell, a 50 y.o. male presents today in the office for the following:    Checking it at [facility]oe, and his BP is not normal.  140/100 last week Diastolic always above 100  On Hyzaar 100/12.5 mg now No SE  Patient Active Problem List  Diagnoses  . THROMBOCYTOPENIA  . HYPERTENSION  . ALLERGIC RHINITIS  . FOOT PAIN, LEFT  . LACERATION, FINGER   Past Medical History  Diagnosis Date  . HTN (hypertension)   . Allergic rhinitis due to pollen   . ITP (idiopathic thrombocytopenic purpura)     off prednisone since 02/2010   Past Surgical History  Procedure Date  . Nasal sinus surgery    History  Substance Use Topics  . Smoking status: Never Smoker   . Smokeless tobacco: Not on file  . Alcohol Use: Not on file   No family history on file. No Known Allergies Current Outpatient Prescriptions on File Prior to Visit  Medication Sig Dispense Refill  . ascorbic acid (VITAMIN C) 500 MG tablet Take 500 mg by mouth daily.        . Multiple Vitamin (MULTIVITAMIN PO) Take 1 tablet by mouth daily.        Marland Kitchen DISCONTD: losartan-hydrochlorothiazide (HYZAAR) 100-12.5 MG per tablet Take 1 tablet by mouth daily.        ROS: GEN: No acute illnesses, no fevers, chills. GI: No n/v/d, eating normally Pulm: No SOB Interactive and getting along well at home.  Otherwise, ROS is as per the HPI.   Physical Exam  Blood pressure 130/80, pulse 66, temperature 98.1 F (36.7 C), temperature source Oral, height 6' (1.829 m), weight 232 lb 6.4 oz (105.416 kg), SpO2 98.00%.  GEN: WDWN, NAD, Non-toxic, A & O x 3 HEENT: Atraumatic, Normocephalic. Neck supple. No masses, No LAD. Ears and Nose: No external deformity. CV: RRR, No M/G/R. No JVD. No thrill. No extra heart sounds. PULM: CTA B, no wheezes, crackles, rhonchi. No retractions. No resp. distress. No accessory muscle use. EXTR: No c/c/e NEURO Normal gait.  PSYCH: Normally interactive. Conversant. Not depressed or anxious appearing.  Calm  demeanor.   A/P: HTN: increase to Hyzaar 100/25

## 2010-12-08 NOTE — Patient Instructions (Signed)
Check blood pressure 2-3 times a week, write it down.  If consistently above 140/90 --- call and f/u so we can add more.

## 2011-03-10 ENCOUNTER — Encounter: Payer: Self-pay | Admitting: Family Medicine

## 2011-03-10 ENCOUNTER — Ambulatory Visit (INDEPENDENT_AMBULATORY_CARE_PROVIDER_SITE_OTHER): Payer: BC Managed Care – PPO | Admitting: Family Medicine

## 2011-03-10 ENCOUNTER — Ambulatory Visit: Payer: BC Managed Care – PPO | Admitting: Family Medicine

## 2011-03-10 VITALS — BP 118/80 | HR 60 | Temp 98.6°F | Wt 234.5 lb

## 2011-03-10 DIAGNOSIS — G4700 Insomnia, unspecified: Secondary | ICD-10-CM

## 2011-03-10 DIAGNOSIS — J069 Acute upper respiratory infection, unspecified: Secondary | ICD-10-CM

## 2011-03-10 MED ORDER — HYDROCOD POLST-CHLORPHEN POLST 10-8 MG/5ML PO LQCR: 5.0000 mL | Freq: Every evening | ORAL | Status: DC | PRN

## 2011-03-10 NOTE — Progress Notes (Signed)
  Subjective:    Patient ID: Jonathan Blackwell, male    DOB: 1960/11/05, 50 y.o.   MRN: [id number]  HPI CC: cough  7d h/o productive cough and mild feverish feeling.  + sore throat, chest congestion, cough.  Some SOB.  Having coughing episodes, productive of dark green sputum.  + HA as well.  More chest congestion.  Has tried tylenol.  Cough keeping him up at night.    No posttussive emesis.  No abd pain, n/v, rashes.  No ear pain or tooth pain.  Recent travel to [place].  Daughters sick as well.  No smokers at home, no h/o COPD/asthma.  + seasonal allergies, not bothering him now.  Doesn't sleep in general.  ambien doesn't help.  Thinks benadryl makes him restless.  Has talked with PCP about lunesta in past.  Sometimes feels confuses day and night.  Stays away from NSAIDs 2/2 ITP.  Review of Systems Per HPI    Objective:   Physical Exam  Constitutional: He appears well-developed and well-nourished. No distress.  HENT:  Head: Normocephalic and atraumatic.  Right Ear: Tympanic membrane, external ear and ear canal normal.  Left Ear: Tympanic membrane, external ear and ear canal normal.  Nose: Nose normal. No mucosal edema or rhinorrhea. Right sinus exhibits no maxillary sinus tenderness and no frontal sinus tenderness. Left sinus exhibits no maxillary sinus tenderness and no frontal sinus tenderness.  Mouth/Throat: Oropharynx is clear and moist. No oropharyngeal exudate.  Eyes: Conjunctivae and EOM are normal. Pupils are equal, round, and reactive to light. No scleral icterus.  Neck: Normal range of motion. Neck supple. No thyromegaly present.  Cardiovascular: Normal rate, regular rhythm, normal heart sounds and intact distal pulses.   No murmur heard. Pulmonary/Chest: Effort normal and breath sounds normal. No respiratory distress. He has no wheezes. He has no rales.  Lymphadenopathy:    He has no cervical adenopathy.  Skin: Skin is warm and dry. No rash noted.          Assessment  & Plan:

## 2011-03-10 NOTE — Assessment & Plan Note (Signed)
Discussed sleep hygiene. To try melatonin first, if not improving consider lunesta vs ambienCR.

## 2011-03-10 NOTE — Assessment & Plan Note (Signed)
Supportive care. Use tussionex for cough. Update if fever >101.5, worsening cough or SOB.

## 2011-03-10 NOTE — Patient Instructions (Addendum)
Sounds like you have a viral upper respiratory infection. Antibiotics are not needed for this.  Viral infections usually take 7-10 days to resolve.  The cough can last 4 weeks to go away. Use medication as prescribed: tussionex for cough at night. Push fluids and plenty of rest. Please return if you are not improving as expected, or if you have high fevers (>101.5) or difficulty swallowing or worsening productive cough. Call clinic with questions.  Pleasure to see you today.  For sleep may try melatonin to see if will help.

## 2011-03-21 ENCOUNTER — Telehealth: Payer: Self-pay | Admitting: *Deleted

## 2011-03-21 MED ORDER — AZITHROMYCIN 250 MG PO TABS: ORAL_TABLET | ORAL | Status: AC

## 2011-03-21 NOTE — Telephone Encounter (Signed)
Will start course of antibitoics  To cover for possible bacterial infeciton.

## 2011-03-21 NOTE — Telephone Encounter (Signed)
Patient advised.

## 2011-03-21 NOTE — Telephone Encounter (Signed)
Pt was seen on 9/20.  He is not any better, still has cough- tussionex isn't helping.  Fever around 101.2 for the last two days.  Wants to be seen today but there are no appts available and he is going out of town tomorrow.  [facility] be called in to [facility]?  Please advise.

## 2011-03-28 ENCOUNTER — Telehealth: Payer: Self-pay | Admitting: *Deleted

## 2011-03-28 ENCOUNTER — Ambulatory Visit (INDEPENDENT_AMBULATORY_CARE_PROVIDER_SITE_OTHER): Payer: BC Managed Care – PPO | Admitting: Family Medicine

## 2011-03-28 ENCOUNTER — Encounter: Payer: Self-pay | Admitting: Family Medicine

## 2011-03-28 VITALS — BP 118/82 | HR 60 | Temp 97.6°F | Ht 72.0 in | Wt 228.0 lb

## 2011-03-28 DIAGNOSIS — J069 Acute upper respiratory infection, unspecified: Secondary | ICD-10-CM

## 2011-03-28 MED ORDER — AZITHROMYCIN 250 MG PO TABS: ORAL_TABLET | ORAL | Status: AC

## 2011-03-28 NOTE — Telephone Encounter (Signed)
rx sent

## 2011-03-28 NOTE — Patient Instructions (Signed)
Common Cold, Adult An upper respiratory tract infection, or cold, is a viral infection of the air passages to the lung. Colds are contagious, especially during the first 3 or 4 days. Antibiotics cannot cure a cold. Cold germs are spread by coughs, sneezes, and hand to hand contact. A respiratory tract infection usually clears up in a few days, but some people may be sick for a week or two. HOME CARE INSTRUCTIONS  Only take over-the-counter or prescription medicines for pain, discomfort, or fever as directed by your caregiver.   Be careful not to blow your nose too hard. This may cause a nosebleed.   Use a cool-mist humidifier (vaporizer) to increase air moisture. This will make it easier for you to breath. Do not use hot steam.   Rest as much as possible and get plenty of sleep.   Wash your hands often, especially after you blow your nose. Cover your mouth and nose with a tissue when you sneeze or cough.   Drink at least 8 glasses of clear liquids every day, such as water, fruit juices, tea, clear soups, and carbonated beverages.  SEEK MEDICAL CARE IF:  An oral temperature above 100.5asts 4 days or more, and is not controlled by medication.   You have a sore throat that gets worse or you see white or yellow spots in your throat.   Your cough gets worse or lasts more than 10 days.   You have a rash somewhere on your skin. You have large and tender lumps in your neck.   You have an earache or a headache.   You have thick, greenish or yellowish discharge from your nose.   You cough-up thick yellow, green, gray or bloody mucus (secretions).  SEEK IMMEDIATE MEDICAL CARE IF: You have trouble breathing, chest pain, or your skin or nails look gray or blue. MAKE SURE YOU:   Understand these instructions.   Will watch your condition.   Will get help right away if you are not doing well or get worse.  Document Released: 06/03/2000 Document Re-Released: 05/19/2008 [employer] Patient  Information 2011 [place], [place].

## 2011-03-28 NOTE — Telephone Encounter (Signed)
Patient called stating that he saw you earlier this morning and has decided that he would like for you to call another Zpak in for him. [facility]

## 2011-03-28 NOTE — Progress Notes (Signed)
  Subjective:    Patient ID: Jonathan Blackwell, male    DOB: 01-01-1961, 50 y.o.   MRN: [id number]  HPI 50 yo here for persistent URI symptoms. Saw Dr. Reece Agar on 9/20 for similar symptoms-congestion, cough, malaise.   Zpack called in for him on 10/1- cough no longer productive. No longer feverish. No CP, no SOB.  No posttussive emesis.  No abd pain, n/v, rashes.  No ear pain or tooth pain.   Review of Systems Per HPI    Objective:   Physical Exam  BP 118/82  Pulse 60  Temp(Src) 97.6 F (36.4 C) (Oral)  Ht 6' (1.829 m)  Wt 228 lb (103.42 kg)  BMI 30.92 kg/m2  Constitutional: He appears well-developed and well-nourished. No distress.  HENT:  Head: Normocephalic and atraumatic.  Right Ear: Tympanic membrane, external ear and ear canal normal.  Left Ear: Tympanic membrane, external ear and ear canal normal.  Nose: Nose normal. No mucosal edema or rhinorrhea. Right sinus exhibits no maxillary sinus tenderness and no frontal sinus tenderness. Left sinus exhibits no maxillary sinus tenderness and no frontal sinus tenderness.  Mouth/Throat: Oropharynx is clear and moist. No oropharyngeal exudate.  Eyes: Conjunctivae and EOM are normal. Pupils are equal, round, and reactive to light. No scleral icterus.  Neck: Normal range of motion. Neck supple. No thyromegaly present.  Cardiovascular: Normal rate, regular rhythm, normal heart sounds and intact distal pulses.   No murmur heard. Pulmonary/Chest: Effort normal and breath sounds normal. No respiratory distress. He has no wheezes. He has no rales.  Lymphadenopathy:    He has no cervical adenopathy.  Skin: Skin is warm and dry. No rash noted.     Assessment & Plan:   1. Viral URI with cough    Resolving. Continue supportive care. No further abx necessary at this time. Pt to call if symptoms worsen. The patient indicates understanding of these issues and agrees with the plan.

## 2011-08-18 ENCOUNTER — Encounter: Payer: Self-pay | Admitting: Family Medicine

## 2011-08-18 ENCOUNTER — Ambulatory Visit (INDEPENDENT_AMBULATORY_CARE_PROVIDER_SITE_OTHER): Payer: Managed Care, Other (non HMO) | Admitting: Family Medicine

## 2011-08-18 VITALS — BP 124/80 | HR 68 | Temp 97.8°F | Wt 235.0 lb

## 2011-08-18 DIAGNOSIS — J069 Acute upper respiratory infection, unspecified: Secondary | ICD-10-CM

## 2011-08-18 MED ORDER — FLUOCINONIDE 0.05 % EX CREA: TOPICAL_CREAM | Freq: Two times a day (BID) | CUTANEOUS | Status: AC

## 2011-08-18 MED ORDER — AMOXICILLIN-POT CLAVULANATE 875-125 MG PO TABS: 1.0000 | ORAL_TABLET | Freq: Two times a day (BID) | ORAL | Status: AC

## 2011-08-18 NOTE — Progress Notes (Signed)
SUBJECTIVE:  Jonathan Blackwell is a 51 y.o. male who complains of coryza, congestion, sneezing, sore throat and bilateral sinus pain for 5 days. He denies a history of anorexia, chest pain and chills and denies a history of asthma. Patient denies smoke cigarettes.   Patient Active Problem List  Diagnoses  . THROMBOCYTOPENIA  . HYPERTENSION  . ALLERGIC RHINITIS  . FOOT PAIN, LEFT  . Viral URI with cough  . Insomnia   Past Medical History  Diagnosis Date  . HTN (hypertension)   . Allergic rhinitis due to pollen   . ITP (idiopathic thrombocytopenic purpura)     off prednisone since 02/2010   Past Surgical History  Procedure Date  . Nasal sinus surgery    History  Substance Use Topics  . Smoking status: Never Smoker   . Smokeless tobacco: Not on file  . Alcohol Use: Not on file   No family history on file. No Known Allergies Current Outpatient Prescriptions on File Prior to Visit  Medication Sig Dispense Refill  . ascorbic acid (VITAMIN C) 500 MG tablet Take 500 mg by mouth daily.        Marland Kitchen losartan-hydrochlorothiazide (HYZAAR) 100-25 MG per tablet Take 1 tablet by mouth daily.  90 tablet  3  . Multiple Vitamin (MULTIVITAMIN PO) Take 1 tablet by mouth daily.        . chlorpheniramine-HYDROcodone (TUSSIONEX) 10-8 MG/5ML LQCR Take 5 mLs by mouth at bedtime as needed. Sedation precautions  140 mL  0   The PMH, PSH, Social History, Family History, Medications, and allergies have been reviewed in [facility], and have been updated if relevant.  OBJECTIVE: BP 124/80  Pulse 68  Temp(Src) 97.8 F (36.6 C) (Oral)  Wt 235 lb (106.595 kg)  He appears well, vital signs are as noted. Ears normal.  Throat and pharynx normal.  Neck supple. No adenopathy in the neck. Nose is congested. Sinuses non tender. The chest is clear, without wheezes or rales.  ASSESSMENT:  viral upper respiratory illness  PLAN: Symptomatic therapy suggested: push fluids, rest and return office visit prn if symptoms  persist or worsen. Lack of antibiotic effectiveness discussed with him.  Rx given for Augmentin to use if symptoms progress or do not improve in next 7 days. Call or return to clinic prn if these symptoms worsen or fail to improve as anticipated.

## 2011-12-15 ENCOUNTER — Ambulatory Visit (INDEPENDENT_AMBULATORY_CARE_PROVIDER_SITE_OTHER): Payer: Managed Care, Other (non HMO) | Admitting: Family Medicine

## 2011-12-15 ENCOUNTER — Encounter: Payer: Self-pay | Admitting: Family Medicine

## 2011-12-15 VITALS — BP 120/72 | HR 81 | Temp 98.9°F | Ht 72.0 in | Wt 227.8 lb

## 2011-12-15 DIAGNOSIS — M19049 Primary osteoarthritis, unspecified hand: Secondary | ICD-10-CM

## 2011-12-15 NOTE — Progress Notes (Signed)
   Nature conservation officer at [facility] [address] [place] [address] Phone: [phone] Fax: [phone]   Patient Name: Jonathan Blackwell Date of Birth: 02/20/1961 Age: 51 y.o. Medical Record Number: [id number] Gender: male Date of Encounter: 12/15/2011  History of Present Illness:  Jonathan Blackwell is a 51 y.o. very pleasant male patient who presents with the following:  R PIP with small cyst and pain with flexion of the R 2nd PIP No trauma or injury  Past Medical History, Surgical History, Social History, Family History, Problem List, Medications, and Allergies have been reviewed and updated if relevant.  Prior to Admission medications   Medication Sig Start Date End Date Taking? Authorizing Provider  ascorbic acid (VITAMIN C) 500 MG tablet Take 500 mg by mouth daily.     Yes Historical Provider, MD  fluocinonide cream (LIDEX) 0.05 % Apply topically 2 (two) times daily. 08/18/11 08/17/12 Yes Dianne Dun, MD  Multiple Vitamin (MULTIVITAMIN PO) Take 1 tablet by mouth daily.     Yes Historical Provider, MD  losartan-hydrochlorothiazide (HYZAAR) 100-25 MG per tablet Take 1 tablet by mouth daily. 12/08/10 12/08/11  Hannah Beat, MD    Review of Systems:  GEN: No fevers, chills. Nontoxic. Primarily MSK c/o today. MSK: Detailed in the HPI GI: tolerating PO intake without difficulty Neuro: No numbness, parasthesias, or tingling associated. Otherwise the pertinent positives of the ROS are noted above.    Physical Examination: Filed Vitals:   12/15/11 1110  BP: 120/72  Pulse: 81  Temp: 98.9 F (37.2 C)   Filed Vitals:   12/15/11 1110  Height: 6' (1.829 m)  Weight: 227 lb 12.8 oz (103.329 kg)   Body mass index is 30.90 kg/(m^2). Ideal Body Weight: Weight in (lb) to have BMI = 25: 183.9    GEN: WDWN, NAD, Non-toxic, Alert & Oriented x 3 HEENT: Atraumatic, Normocephalic.  Ears and Nose: No external deformity. EXTR: No clubbing/cyanosis/edema NEURO: Normal gait.    PSYCH: Normally interactive. Conversant. Not depressed or anxious appearing.  Calm demeanor.   B hand Ecchymosis or edema: neg ROM wrist/hand/digits: full  Distal Ulna and Radius: NT Ecchymosis or edema: neg No instability Full composite fist, no malrotation Grip, all digits: 5/5 str DIPJT: mild tenderness at 5th PIP JT: R 2nd with small cyst ttp MCP JT: NT Axial load test: neg Atrophy: neg  Hand sensation: intact   Assessment and Plan: 1. Arthritis of hand     Reassured, hand oa, small cyst at 2nd Tylenol or motrin  Hannah Beat, MD

## 2012-01-16 ENCOUNTER — Other Ambulatory Visit: Payer: Self-pay | Admitting: Family Medicine

## 2012-04-10 ENCOUNTER — Ambulatory Visit (INDEPENDENT_AMBULATORY_CARE_PROVIDER_SITE_OTHER): Payer: Managed Care, Other (non HMO) | Admitting: Family Medicine

## 2012-04-10 ENCOUNTER — Encounter: Payer: Self-pay | Admitting: Family Medicine

## 2012-04-10 VITALS — BP 124/84 | HR 65 | Temp 98.1°F | Resp 20 | Ht 72.0 in | Wt 238.2 lb

## 2012-04-10 DIAGNOSIS — J069 Acute upper respiratory infection, unspecified: Secondary | ICD-10-CM

## 2012-04-10 MED ORDER — BENZONATATE 200 MG PO CAPS: 200.0000 mg | ORAL_CAPSULE | Freq: Two times a day (BID) | ORAL | Status: DC | PRN

## 2012-04-10 NOTE — Progress Notes (Signed)
  Subjective:    Patient ID: Jonathan Blackwell, male    DOB: 11-08-1960, 51 y.o.   MRN: [id number]  Cough This is a new problem. The current episode started in the past 7 days (6 days). The problem has been gradually worsening. The problem occurs every few hours. The cough is productive of sputum (initially productive, no dry). Pertinent negatives include no chills, ear congestion, ear pain, fever, headaches, myalgias, nasal congestion, rash, rhinorrhea, sore throat, shortness of breath or wheezing. Associated symptoms comments: Cough occ keeping him up at night.. Nothing aggravates the symptoms. Risk factors: nonsmoker. He has tried nothing for the symptoms. The treatment provided moderate relief. There is no history of asthma, bronchiectasis, COPD, emphysema, environmental allergies or pneumonia.      Review of Systems  Constitutional: Negative for fever and chills.  HENT: Negative for ear pain, sore throat and rhinorrhea.   Respiratory: Positive for cough. Negative for shortness of breath and wheezing.   Musculoskeletal: Negative for myalgias.  Skin: Negative for rash.  Neurological: Negative for headaches.  Hematological: Negative for environmental allergies.       Objective:   Physical Exam  Constitutional: Vital signs are normal. He appears well-developed and well-nourished.  Non-toxic appearance. He does not appear ill. No distress.  HENT:  Head: Normocephalic and atraumatic.  Right Ear: Hearing, tympanic membrane, external ear and ear canal normal. No tenderness. No foreign bodies. Tympanic membrane is not retracted and not bulging.  Left Ear: Hearing, tympanic membrane, external ear and ear canal normal. No tenderness. No foreign bodies. Tympanic membrane is not retracted and not bulging.  Nose: Nose normal. No mucosal edema or rhinorrhea. Right sinus exhibits no maxillary sinus tenderness and no frontal sinus tenderness. Left sinus exhibits no maxillary sinus tenderness and no  frontal sinus tenderness.  Mouth/Throat: Uvula is midline, oropharynx is clear and moist and mucous membranes are normal. Normal dentition. No dental caries. No oropharyngeal exudate or tonsillar abscesses.  Eyes: Conjunctivae normal, EOM and lids are normal. Pupils are equal, round, and reactive to light. No foreign bodies found.  Neck: Trachea normal, normal range of motion and phonation normal. Neck supple. Carotid bruit is not present. No mass and no thyromegaly present.  Cardiovascular: Normal rate, regular rhythm, S1 normal, S2 normal, normal heart sounds, intact distal pulses and normal pulses.  Exam reveals no gallop.   No murmur heard. Pulmonary/Chest: Effort normal and breath sounds normal. No respiratory distress. He has no wheezes. He has no rhonchi. He has no rales.  Abdominal: Soft. Normal appearance and bowel sounds are normal. There is no hepatosplenomegaly. There is no tenderness. There is no rebound, no guarding and no CVA tenderness. No hernia.  Neurological: He is alert. He has normal reflexes.  Skin: Skin is warm, dry and intact. No rash noted.  Psychiatric: He has a normal mood and affect. His speech is normal and behavior is normal. Judgment normal.          Assessment & Plan:

## 2012-04-10 NOTE — Patient Instructions (Addendum)
Push fluids. Rest when able.  Use tessalon perles (benzonatate)  for cough.  Call if not continuing to improving in 5-7 days, call earlier if fever, or shortness of breath.

## 2012-04-10 NOTE — Assessment & Plan Note (Addendum)
Low risk. NO sign of bacterial infection. Symtpomatic care.

## 2012-11-19 ENCOUNTER — Ambulatory Visit: Payer: Self-pay | Admitting: Unknown Physician Specialty

## 2012-12-20 ENCOUNTER — Other Ambulatory Visit: Payer: Self-pay | Admitting: Family Medicine

## 2013-03-25 ENCOUNTER — Other Ambulatory Visit: Payer: Self-pay | Admitting: Family Medicine

## 2013-03-26 NOTE — Telephone Encounter (Signed)
Last office visit 04/10/2012.  Refill?

## 2013-03-26 NOTE — Telephone Encounter (Signed)
Pt needs appt for CPX with fasting labs prior.. refill until then.

## 2013-03-27 NOTE — Telephone Encounter (Signed)
CPX scheduled for 04/05/2013 at 3:00pm with Dr. Ermalene Searing.

## 2013-03-31 ENCOUNTER — Telehealth: Payer: Self-pay | Admitting: Family Medicine

## 2013-03-31 DIAGNOSIS — Z125 Encounter for screening for malignant neoplasm of prostate: Secondary | ICD-10-CM

## 2013-03-31 DIAGNOSIS — I10 Essential (primary) hypertension: Secondary | ICD-10-CM

## 2013-03-31 DIAGNOSIS — D696 Thrombocytopenia, unspecified: Secondary | ICD-10-CM

## 2013-03-31 NOTE — Telephone Encounter (Signed)
Message copied by Excell Seltzer on Sun Mar 31, 2013 11:35 PM ------      Message from: Alvina Chou      Created: Wed Mar 27, 2013  3:02 PM      Regarding: Lab orders for Monday, 10.13.14       Patient is scheduled for CPX labs, please order future labs, Thanks , Terri       ------

## 2013-04-01 ENCOUNTER — Other Ambulatory Visit (INDEPENDENT_AMBULATORY_CARE_PROVIDER_SITE_OTHER): Payer: Managed Care, Other (non HMO)

## 2013-04-01 DIAGNOSIS — D696 Thrombocytopenia, unspecified: Secondary | ICD-10-CM

## 2013-04-01 DIAGNOSIS — Z125 Encounter for screening for malignant neoplasm of prostate: Secondary | ICD-10-CM

## 2013-04-01 DIAGNOSIS — I10 Essential (primary) hypertension: Secondary | ICD-10-CM

## 2013-04-01 LAB — CBC WITH DIFFERENTIAL/PLATELET
Basophils Absolute: 0 10*3/uL (ref 0.0–0.1)
Eosinophils Absolute: 0.3 10*3/uL (ref 0.0–0.7)
Lymphocytes Relative: 35 % (ref 12.0–46.0)
MCHC: 34.2 g/dL (ref 30.0–36.0)
Monocytes Relative: 7.7 % (ref 3.0–12.0)
Neutro Abs: 4.2 10*3/uL (ref 1.4–7.7)
Neutrophils Relative %: 53.1 % (ref 43.0–77.0)
Platelets: 193 10*3/uL (ref 150.0–400.0)
RDW: 13.4 % (ref 11.5–14.6)

## 2013-04-01 LAB — COMPREHENSIVE METABOLIC PANEL
Alkaline Phosphatase: 47 U/L (ref 39–117)
CO2: 30 mEq/L (ref 19–32)
Creatinine, Ser: 1.2 mg/dL (ref 0.4–1.5)
GFR: 69.84 mL/min (ref >60.00)
Glucose, Bld: 91 mg/dL (ref 70–99)
Sodium: 138 mEq/L (ref 135–145)
Total Bilirubin: 1.3 mg/dL — ABNORMAL HIGH (ref 0.3–1.2)
Total Protein: 7 g/dL (ref 6.0–8.3)

## 2013-04-01 LAB — LIPID PANEL
Cholesterol: 213 mg/dL — ABNORMAL HIGH (ref 0–200)
HDL: 42.7 mg/dL (ref >39.00)
Total CHOL/HDL Ratio: 5
Triglycerides: 109 mg/dL (ref 0.0–149.0)
VLDL: 21.8 mg/dL (ref 0.0–40.0)

## 2013-04-01 LAB — LDL CHOLESTEROL, DIRECT: Direct LDL: 154.2 mg/dL

## 2013-04-05 ENCOUNTER — Encounter: Payer: Self-pay | Admitting: Family Medicine

## 2013-04-05 ENCOUNTER — Ambulatory Visit (INDEPENDENT_AMBULATORY_CARE_PROVIDER_SITE_OTHER): Payer: Managed Care, Other (non HMO) | Admitting: Family Medicine

## 2013-04-05 VITALS — BP 120/80 | HR 74 | Temp 98.2°F | Ht 71.0 in | Wt 229.0 lb

## 2013-04-05 DIAGNOSIS — Z0000 Encounter for general adult medical examination without abnormal findings: Secondary | ICD-10-CM

## 2013-04-05 DIAGNOSIS — E7800 Pure hypercholesterolemia, unspecified: Secondary | ICD-10-CM

## 2013-04-05 DIAGNOSIS — Z1212 Encounter for screening for malignant neoplasm of rectum: Secondary | ICD-10-CM

## 2013-04-05 DIAGNOSIS — E785 Hyperlipidemia, unspecified: Secondary | ICD-10-CM | POA: Insufficient documentation

## 2013-04-05 DIAGNOSIS — R7401 Elevation of levels of liver transaminase levels: Secondary | ICD-10-CM | POA: Insufficient documentation

## 2013-04-05 DIAGNOSIS — I10 Essential (primary) hypertension: Secondary | ICD-10-CM

## 2013-04-05 NOTE — Progress Notes (Signed)
Subjective:    Patient ID: Jonathan Blackwell, male    DOB: Sep 01, 1960, 52 y.o.   MRN: [id number]  HPI The patient is here for annual wellness exam and preventative care.    Hypertension:   Well controlled on hyzaar. Using medication without problems or lightheadedness:  none Chest pain with exertion:none Edema:None Short of breath:None Average home BPs: later in the day once 160/100s.. Wrist cuff.   Other issues:  Reviewed labs in detail LDL not at goal < 130. .  Lab Results  Component Value Date   CHOL 213* 04/01/2013   HDL 42.70 04/01/2013   LDLCALC 133* 05/01/2009   LDLDIRECT 154.2 04/01/2013   TRIG 109.0 04/01/2013   CHOLHDL 5 04/01/2013   Has restarted exercise in last 2 weeks.   Going 5 days a week. Has made aggressive changes in diet. Wt Readings from Last 3 Encounters:  04/05/13 229 lb (103.874 kg)  04/10/12 238 lb 4 oz (108.069 kg)  12/15/11 227 lb 12.8 oz (103.329 kg)   Elevated liver transaminases: minimal alcohol.. Has bene using a lot of tylenol, 3-4  adya.  Review of Systems  Constitutional: Negative for fever, fatigue and unexpected weight change.  HENT: Negative for congestion, ear pain, postnasal drip, rhinorrhea, sore throat and trouble swallowing.   Eyes: Negative for pain.  Respiratory: Negative for cough, shortness of breath and wheezing.   Cardiovascular: Negative for chest pain, palpitations and leg swelling.  Gastrointestinal: Negative for nausea, abdominal pain, diarrhea, constipation and blood in stool.  Genitourinary: Negative for dysuria, urgency, hematuria, discharge, penile swelling, scrotal swelling, difficulty urinating, penile pain and testicular pain.  Skin: Negative for rash.  Neurological: Negative for syncope, weakness, light-headedness, numbness and headaches.  Psychiatric/Behavioral: Negative for behavioral problems and dysphoric mood. The patient is not nervous/anxious.        Objective:   Physical Exam  Constitutional: He  appears well-developed and well-nourished.  Non-toxic appearance. He does not appear ill. No distress.  HENT:  Head: Normocephalic and atraumatic.  Right Ear: Hearing, tympanic membrane, external ear and ear canal normal.  Left Ear: Hearing, tympanic membrane, external ear and ear canal normal.  Nose: Nose normal.  Mouth/Throat: Uvula is midline, oropharynx is clear and moist and mucous membranes are normal.  Eyes: Conjunctivae, EOM and lids are normal. Pupils are equal, round, and reactive to light. Lids are everted and swept, no foreign bodies found.  Neck: Trachea normal, normal range of motion and phonation normal. Neck supple. Carotid bruit is not present. No mass and no thyromegaly present.  Cardiovascular: Normal rate, regular rhythm, S1 normal, S2 normal, intact distal pulses and normal pulses.  Exam reveals no gallop.   No murmur heard. Pulmonary/Chest: Breath sounds normal. He has no wheezes. He has no rhonchi. He has no rales.  Abdominal: Soft. Normal appearance and bowel sounds are normal. There is no hepatosplenomegaly. There is no tenderness. There is no rebound, no guarding and no CVA tenderness. No hernia. Hernia confirmed negative in the right inguinal area and confirmed negative in the left inguinal area.  Genitourinary: Prostate normal, testes normal and penis normal. Rectal exam shows no external hemorrhoid, no internal hemorrhoid, no fissure, no mass, no tenderness and anal tone normal. Guaiac negative stool. Prostate is not enlarged and not tender. Right testis shows no mass and no tenderness. Left testis shows no mass and no tenderness. No paraphimosis or penile tenderness.  Lymphadenopathy:    He has no cervical adenopathy.  Right: No inguinal adenopathy present.       Left: No inguinal adenopathy present.  Neurological: He is alert. He has normal strength and normal reflexes. No cranial nerve deficit or sensory deficit. Gait normal.  Skin: Skin is warm, dry and  intact. No rash noted.  Psychiatric: He has a normal mood and affect. His speech is normal and behavior is normal. Judgment normal.          Assessment & Plan:  The patient's preventative maintenance and recommended screening tests for an annual wellness exam were reviewed in full today. Brought up to date unless services declined.  Counselled on the importance of diet, exercise, and its role in overall health and mortality. The patient's FH and SH was reviewed, including their home life, tobacco status, and drug and alcohol status.   Vaccines: refused tetanus and flu  Colonoscopy: Never had. No family colon cancer. Plans ifob for now. PSA: Stable. Lab Results  Component Value Date   PSA 0.51 04/01/2013

## 2013-04-05 NOTE — Patient Instructions (Addendum)
Get arm cuff... Follow at home.. Call with measurements in the next 1-2 weeks.. Day and night.  For liver function changes: stop tyelnol and alcohol for now,  Return in 1 week for liver labs. Return for lab only visit in 3 months, fasting.  Stop at lab for stool testing.

## 2013-04-15 ENCOUNTER — Encounter: Payer: Self-pay | Admitting: Family Medicine

## 2013-04-15 ENCOUNTER — Other Ambulatory Visit (INDEPENDENT_AMBULATORY_CARE_PROVIDER_SITE_OTHER): Payer: Managed Care, Other (non HMO)

## 2013-04-15 DIAGNOSIS — R7401 Elevation of levels of liver transaminase levels: Secondary | ICD-10-CM

## 2013-04-15 LAB — HEPATIC FUNCTION PANEL
ALT: 76 U/L — ABNORMAL HIGH (ref 0–53)
Albumin: 4.3 g/dL (ref 3.5–5.2)
Bilirubin, Direct: 0.2 mg/dL (ref 0.0–0.3)
Total Protein: 7 g/dL (ref 6.0–8.3)

## 2013-04-16 ENCOUNTER — Telehealth: Payer: Self-pay | Admitting: *Deleted

## 2013-04-16 DIAGNOSIS — R7401 Elevation of levels of liver transaminase levels: Secondary | ICD-10-CM

## 2013-04-16 LAB — HEPATITIS PANEL, ACUTE
HCV Ab: NEGATIVE
Hep B C IgM: NONREACTIVE
Hepatitis B Surface Ag: NEGATIVE

## 2013-04-16 NOTE — Telephone Encounter (Signed)
Jonathan Blackwell left voicemail on my phone.  Needs a CPT code for the U/S to give his insurance.  Also, he is going out of town on Thursday and would really like to go ahead to have this U/S either Wednesday or Thursday.  Will forward to Dr. Ermalene Searing to order ultrasound.

## 2013-04-17 ENCOUNTER — Encounter: Payer: Self-pay | Admitting: Family Medicine

## 2013-04-17 ENCOUNTER — Ambulatory Visit
Admission: RE | Admit: 2013-04-17 | Discharge: 2013-04-17 | Disposition: A | Discharge status: Home / Self Care | Payer: No Typology Code available for payment source | Source: Ambulatory Visit | Attending: Family Medicine | Admitting: Family Medicine

## 2013-04-17 DIAGNOSIS — R7401 Elevation of levels of liver transaminase levels: Secondary | ICD-10-CM

## 2013-04-18 ENCOUNTER — Encounter: Payer: Self-pay | Admitting: *Deleted

## 2013-04-25 ENCOUNTER — Other Ambulatory Visit: Payer: Self-pay

## 2013-05-01 ENCOUNTER — Ambulatory Visit: Payer: Self-pay | Admitting: Family Medicine

## 2013-05-02 ENCOUNTER — Ambulatory Visit (INDEPENDENT_AMBULATORY_CARE_PROVIDER_SITE_OTHER): Payer: Managed Care, Other (non HMO) | Admitting: Family Medicine

## 2013-05-02 ENCOUNTER — Encounter: Payer: Self-pay | Admitting: Family Medicine

## 2013-05-02 VITALS — BP 120/78 | HR 74 | Temp 98.9°F | Ht 71.0 in | Wt 223.5 lb

## 2013-05-02 DIAGNOSIS — H9209 Otalgia, unspecified ear: Secondary | ICD-10-CM

## 2013-05-02 DIAGNOSIS — H9201 Otalgia, right ear: Secondary | ICD-10-CM

## 2013-05-02 DIAGNOSIS — J040 Acute laryngitis: Secondary | ICD-10-CM

## 2013-05-02 MED ORDER — AMOXICILLIN 500 MG PO CAPS: 1000.0000 mg | ORAL_CAPSULE | Freq: Two times a day (BID) | ORAL | Status: DC

## 2013-05-02 NOTE — Progress Notes (Signed)
   Date:  05/02/2013   Name:  Jonathan Blackwell   DOB:  05/02/1961   MRN:  [id number] Gender: male Age: 52 y.o.  Primary Physician:  Kerby Nora, MD   Chief Complaint: Cough and Right Ear congestion   Subjective:   History of Present Illness:  Jonathan Blackwell is a 52 y.o. very pleasant male patient who presents with the following:  3-4 days ago and voice dropped and scratchy.  Some dry cough.  R ear pain No runny nose No n/v/d  Past Medical History, Surgical History, Social History, Family History, Problem List, Medications, and Allergies have been reviewed and updated if relevant.  Review of Systems: ROS: GEN: Acute illness details above GI: Tolerating PO intake GU: maintaining adequate hydration and urination Pulm: No SOB Interactive and getting along well at home.  Otherwise, ROS is as per the HPI.   Objective:   Physical Examination: BP 120/78  Pulse 74  Temp(Src) 98.9 F (37.2 C) (Oral)  Ht 5\' 11"  (1.803 m)  Wt 223 lb 8 oz (101.379 kg)  BMI 31.19 kg/m2   Gen: WDWN, NAD; A & O x3, cooperative. Pleasant.Globally Non-toxic HEENT: Normocephalic and atraumatic. Throat clear, w/o exudate, R TM flat with poor cone of light, L TM - good landmarks, No fluid present. no rhinnorhea.  MMM Frontal sinuses: NT Max sinuses: NT NECK: Anterior cervical  LAD is absent CV: RRR, No M/G/R, cap refill <2 sec PULM: Breathing comfortably in no respiratory distress. no wheezing, crackles, rhonchi EXT: No c/c/e PSYCH: Friendly, good eye contact MSK: Nml gait      Assessment & Plan:    Laryngitis  Ear pain, right  Watchful waiting, if not improved or worsens in the next few days then start antibiotics  There are no Patient Instructions on file for this visit.  Orders Today:  No orders of the defined types were placed in this encounter.    New medications, updates to list, dose adjustments: Meds ordered this encounter  Medications  . amoxicillin (AMOXIL) 500 MG  capsule    Sig: Take 2 capsules (1,000 mg total) by mouth 2 (two) times daily.    Dispense:  40 capsule    Refill:  0    Signed,  Kyrie Fludd T. Jeneen Doutt, MD, CAQ Sports Medicine  [facility] at [facility] [address] [place] [address] Phone: [phone] Fax: [phone]  Updated Complete Medication List:   Medication List       This list is accurate as of: 05/02/13 10:45 AM.  Always use your most recent med list.               amoxicillin 500 MG capsule  Commonly known as:  AMOXIL  Take 2 capsules (1,000 mg total) by mouth 2 (two) times daily.     losartan-hydrochlorothiazide 100-25 MG per tablet  Commonly known as:  HYZAAR  TAKE 1 TABLET BY MOUTH DAILY.

## 2013-05-02 NOTE — Progress Notes (Signed)
Pre-visit discussion using our clinic review tool. No additional management support is needed unless otherwise documented below in the visit note.  

## 2013-06-04 ENCOUNTER — Other Ambulatory Visit: Payer: Self-pay | Admitting: Family Medicine

## 2013-07-16 ENCOUNTER — Encounter: Payer: Self-pay | Admitting: Internal Medicine

## 2013-07-16 ENCOUNTER — Ambulatory Visit (INDEPENDENT_AMBULATORY_CARE_PROVIDER_SITE_OTHER): Payer: Managed Care, Other (non HMO) | Admitting: Internal Medicine

## 2013-07-16 VITALS — BP 124/80 | HR 77 | Temp 97.7°F | Wt 222.2 lb

## 2013-07-16 DIAGNOSIS — J069 Acute upper respiratory infection, unspecified: Secondary | ICD-10-CM

## 2013-07-16 DIAGNOSIS — J309 Allergic rhinitis, unspecified: Secondary | ICD-10-CM

## 2013-07-16 MED ORDER — FLUTICASONE PROPIONATE 50 MCG/ACT NA SUSP: 2.0000 | Freq: Every day | NASAL | Status: DC

## 2013-07-16 MED ORDER — AZITHROMYCIN 250 MG PO TABS: ORAL_TABLET | ORAL | Status: DC

## 2013-07-16 NOTE — Progress Notes (Signed)
Pre-visit discussion using our clinic review tool. No additional management support is needed unless otherwise documented below in the visit note.  

## 2013-07-16 NOTE — Progress Notes (Signed)
HPI  Pt presents to the clinic today with c/o cough, chest congestion. This started 2 days ago. The cough is productive but he is not sure of the color, he does not look at it. Every time he coughs, he feels a burning in his chest. He denies fever, chills or body aches. He has tried Tylenol OTC. He does have a history of allergic rhinitis.  Review of Systems      Past Medical History  Diagnosis Date  . HTN (hypertension)   . Allergic rhinitis due to pollen   . ITP (idiopathic thrombocytopenic purpura)     off prednisone since 02/2010    History reviewed. No pertinent family history.  History   Social History  . Marital Status: Married    Spouse Name: N/A    Number of Children: 3  . 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 Smoking status: Never Smoker   . Smokeless tobacco: Never Used  . Alcohol Use: Yes     Comment: occ wine  . Drug Use: No  . Sexual Activity: Not on file   Other Topics Concern  . Not on file   Social History Narrative  . No narrative on file    No Known Allergies   Constitutional: Positive headache, fatiguer. Denies fever or abrupt weight changes.  HEENT:  Positive nasal congestion. Denies eye redness, eye pain, pressure behind the eyes, facial pain, ear pain, ringing in the ears, wax buildup, runny nose or bloody nose. Respiratory: Positive cough. Denies difficulty breathing or shortness of breath.  Cardiovascular: Denies chest pain, chest tightness, palpitations or swelling in the hands or feet.   No other specific complaints in a complete review of systems (except as listed in HPI above).  Objective:   BP 124/80  Pulse 77  Temp(Src) 97.7 F (36.5 C) (Oral)  Wt 222 lb 4 oz (100.812 kg)  SpO2 98% Wt Readings from Last 3 Encounters:  07/16/13 222 lb 4 oz (100.812 kg)  05/02/13 223 lb 8 oz (101.379 kg)  04/05/13 229 lb (103.874 kg)     General: Appears his stated age,  well developed, well nourished in NAD. HEENT: Head: normal shape and size; Eyes: sclera white, no icterus, conjunctiva pink, PERRLA and EOMs intact; Ears: Tm's gray and intact, dull light reflex, + effusions bilaterally; Nose: mucosa pink and moist, septum midline; Throat/Mouth: + PND. Teeth present, mucosa erythematous and moist, no exudate noted, no lesions or ulcerations noted.  Neck: Neck supple, trachea midline. No massses, lumps or thyromegaly present.  Cardiovascular: Normal rate and rhythm. S1,S2 noted.  No murmur, rubs or gallops noted. No JVD or BLE edema. No carotid bruits noted. Pulmonary/Chest: Normal effort and positive vesicular breath sounds. No respiratory distress. No wheezes, rales or ronchi noted.      Assessment & Plan:   Upper Respiratory Infection, likely viral at this point:  Get some rest and drink plenty of water Try zyrtec OTC for nasal congestion- we will refill your flonase today eRx for Azithromax x 5 days to take in a few days if no improvement with OTC medications   RTC as needed or if symptoms persist.

## 2013-07-16 NOTE — Progress Notes (Signed)
HPI: Pt presents to the office today with complaints of nasal congestion, cough, and chest congestion for two days. He states his chest is burning and gets worse when he coughs. Pt states he feels fatigued and worn down. He denies shortness of breath, chest pain, sore throat, fever, or chills.   Past Medical History  Diagnosis Date  . HTN (hypertension)   . Allergic rhinitis due to pollen   . ITP (idiopathic thrombocytopenic purpura)     off prednisone since 02/2010    Current Outpatient Prescriptions  Medication Sig Dispense Refill  . losartan-hydrochlorothiazide (HYZAAR) 100-25 MG per tablet TAKE 1 TABLET BY MOUTH EVERY DAY  30 tablet  5   No current facility-administered medications for this visit.    No Known Allergies  History reviewed. No pertinent family history.  History   Social History  . Marital Status: Married    Spouse Name: N/A    Number of Children: 3  . 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 Smoking status: Never Smoker   . Smokeless tobacco: Never Used  . Alcohol Use: Yes     Comment: occ wine  . Drug Use: No  . Sexual Activity: Not on file   Other Topics Concern  . Not on file   Social History Narrative  . No narrative on file    ROS:  Constitutional: Denies fever, malaise, fatigue, headache or abrupt weight changes.  HEENT:Endorses nasal discharge and congestion and ear fullness r >L.  Denies eye pain, eye redness, ear pain, ringing in the ears, wax buildup bloody nose, or sore throat. Respiratory:Endorses cough with sputum prodution. Denies difficulty breathing, shortness of breath, cough or sputum production.   Cardiovascular: Denies chest pain, chest tightness, palpitations or swelling in the hands or feet.   PE:  BP 124/80  Pulse 77  Temp(Src) 97.7 F (36.5 C) (Oral)  Wt 222 lb 4 oz (100.812 kg)  SpO2 98% Wt Readings from Last 3 Encounters:  07/16/13 222 lb 4 oz  (100.812 kg)  05/02/13 223 lb 8 oz (101.379 kg)  04/05/13 229 lb (103.874 kg)    General: Appears their stated age, well developed, well nourished in NAD. HEENT: Head: normal shape and size; Eyes: sclera white, no icterus, conjunctiva pink, PERRLA and EOMs intact; Ears: Tm's gray and intact, normal light reflex, serous clear fluid around TM, R>L, no redness noted; Nose: mucosa pink and moist, septum midline, septum inflammed; Throat/Mouth: Teeth present, mucosa pink and moist, no lesions or ulcerations noted.  Neck: Normal range of motion. Neck supple, trachea midline. No massses, lumps or thyromegaly present.  Cardiovascular: Normal rate and rhythm. S1,S2 noted.  No murmur, rubs or gallops noted. No JVD or BLE edema. No carotid bruits noted. Pulmonary/Chest: Normal effort and positive vesicular breath sounds. No respiratory distress. No wheezes, rales or ronchi noted.   Assessment and Plan: URI: Most likely viral, will prescribe Z pack if needed if he does not improve Supportive care rest, fluids OTC antihistamine daily for nasal congestion Refilled Flonase Follow up in 3-5 days if no improvement  Malala Trenkamp S, Student-NP

## 2013-07-16 NOTE — Patient Instructions (Signed)

## 2013-11-07 ENCOUNTER — Ambulatory Visit: Payer: Managed Care, Other (non HMO) | Admitting: Family Medicine

## 2013-11-07 DIAGNOSIS — Z0289 Encounter for other administrative examinations: Secondary | ICD-10-CM

## 2014-03-20 ENCOUNTER — Ambulatory Visit (INDEPENDENT_AMBULATORY_CARE_PROVIDER_SITE_OTHER): Payer: Managed Care, Other (non HMO) | Admitting: Internal Medicine

## 2014-03-20 ENCOUNTER — Encounter: Payer: Self-pay | Admitting: Internal Medicine

## 2014-03-20 VITALS — BP 146/94 | HR 56 | Temp 98.0°F | Wt 235.0 lb

## 2014-03-20 DIAGNOSIS — J309 Allergic rhinitis, unspecified: Secondary | ICD-10-CM

## 2014-03-20 MED ORDER — PREDNISONE 10 MG PO TABS
ORAL_TABLET | ORAL | Status: DC
Start: 2014-03-20 — End: 2014-04-28

## 2014-03-20 NOTE — Progress Notes (Signed)
Pre visit review using our clinic review tool, if applicable. No additional management support is needed unless otherwise documented below in the visit note. 

## 2014-03-20 NOTE — Progress Notes (Signed)
HPI  Pt presents to the clinic today with c/o ear pain, scratchy throat and dry cough. This started 2 weeks ago. He is blowing clear mucous out of his nose. His cough is dry. He denies fever, chills or body aches. He has tried zyrtec, zicam, saline and flonase with no improvement. He reports that he has had sick contacts who received antibiotics for a URI. He does have a history of seasonal allergies.  Review of Systems    Past Medical History  Diagnosis Date  . HTN (hypertension)   . Allergic rhinitis due to pollen   . ITP (idiopathic thrombocytopenic purpura)     off prednisone since 02/2010    No family history on file.  History   Social History  . Marital Status: Married    Spouse Name: N/A    Number of Children: 3  . 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 Smoking status: Never Smoker   . Smokeless tobacco: Never Used  . Alcohol Use: Yes     Comment: occ wine  . Drug Use: No  . Sexual Activity: Not on file   Other Topics Concern  . Not on file   Social History Narrative  . No narrative on file    No Known Allergies   Constitutional: Positive headache. Denies fatigue, fever or abrupt weight changes.  HEENT:  Positive  facial pain, nasal congestion and sore throat. Denies eye redness, ear pain, ringing in the ears, wax buildup, runny nose or bloody nose. Respiratory: Positive cough. Denies difficulty breathing or shortness of breath.  Cardiovascular: Denies chest pain, chest tightness, palpitations or swelling in the hands or feet.   No other specific complaints in a complete review of systems (except as listed in HPI above).  Objective:  BP 146/94  Pulse 56  Temp(Src) 98 F (36.7 C) (Oral)  Wt 235 lb (106.595 kg)  SpO2 99%   General: Appears his stated age, well developed, well nourished in NAD. HEENT: Head: normal shape and size, no sinus tenderness noted; Ears: Tm's gray and intact,  normal light reflex; Nose: mucosa pink and moist, septum midline; Throat/Mouth: + PND. Teeth present, mucosa pink and moist, no exudate noted, no lesions or ulcerations noted.  Cardiovascular: Normal rate and rhythm. S1,S2 noted.  No murmur, rubs or gallops noted. No JVD or BLE edema. No carotid bruits noted. Pulmonary/Chest: Normal effort and positive vesicular breath sounds. No respiratory distress. No wheezes, rales or ronchi noted.      Assessment & Plan:   Allergic Rhinitis  Can use a Neti Pot which can be purchased from your local drug store. Flonase 2 sprays each nostril for 3 days and then as needed. eRx for pred taper x 6 days  RTC as needed or if symptoms persist.

## 2014-03-20 NOTE — Patient Instructions (Addendum)

## 2014-04-28 ENCOUNTER — Ambulatory Visit (INDEPENDENT_AMBULATORY_CARE_PROVIDER_SITE_OTHER)
Admission: RE | Admit: 2014-04-28 | Discharge: 2014-04-28 | Disposition: A | Discharge status: Home / Self Care | Payer: Managed Care, Other (non HMO) | Source: Ambulatory Visit | Attending: Family Medicine | Admitting: Family Medicine

## 2014-04-28 ENCOUNTER — Encounter: Payer: Self-pay | Admitting: Family Medicine

## 2014-04-28 ENCOUNTER — Ambulatory Visit (INDEPENDENT_AMBULATORY_CARE_PROVIDER_SITE_OTHER): Payer: Managed Care, Other (non HMO) | Admitting: Family Medicine

## 2014-04-28 VITALS — BP 140/90 | HR 57 | Temp 97.9°F | Ht 71.0 in | Wt 236.8 lb

## 2014-04-28 DIAGNOSIS — M5010 Cervical disc disorder with radiculopathy, unspecified cervical region: Secondary | ICD-10-CM

## 2014-04-28 MED ORDER — PREDNISONE 20 MG PO TABS: ORAL_TABLET | ORAL | Status: DC

## 2014-04-28 NOTE — Progress Notes (Signed)
Pre visit review using our clinic review tool, if applicable. No additional management support is needed unless otherwise documented below in the visit note. 

## 2014-04-28 NOTE — Progress Notes (Signed)
Dr. Karleen HampshireSpencer T. Brookelin Felber, MD, CAQ Sports Medicine [facility] [address] [place], [id number] Phone: [phone] Fax: [phone]  04/28/2014  Patient: Jonathan Blackwell Wisinski, MRN: [id number], DOB: 14-Nov-1960, 53 y.o.  Primary Physician:  Kerby NoraAmy Bedsole, MD  Chief Complaint: Numbness and Neck Pain   Subjective:   Jonathan Blackwell Nakanishi is a 53 y.o. very pleasant male patient who presents with the following:  3 weeks Left sided on the side of neck and now using a flat pillow and fomes underneath the shoulder blade and numbness down the left side.   Went to the chiropractor. (Dr. Georgianne FickVin in [place])  Did some accupuncture, chiropractor, masseuse. Eased up a little bit.   Now feeling it on the low back.   He is having pain in his neck, in his shoulder blade, and he is having some altered sensation and numbness in his LEFT arm relatively diffusely.  He is not having any changes in his strength.  Past Medical History, Surgical History, Social History, Family History, Problem List, Medications, and Allergies have been reviewed and updated if relevant.  GEN: no acute illness or fever CV: No chest pain or shortness of breath MSK: detailed above Neuro: neurological signs are described above ROS O/w per HPI  Objective:   BP 140/90 mmHg  Pulse 57  Temp(Src) 97.9 F (36.6 C) (Oral)  Ht 5\' 11"  (1.803 m)  Wt 236 lb 12 oz (107.389 kg)  BMI 33.03 kg/m2   GEN: Well-developed,well-nourished,in no acute distress; alert,appropriate and cooperative throughout examination HEENT: Normocephalic and atraumatic without obvious abnormalities. Ears, externally no deformities PULM: Breathing comfortably in no respiratory distress EXT: No clubbing, cyanosis, or edema PSYCH: Normally interactive. Cooperative during the interview. Pleasant. Friendly and conversant. Not anxious or depressed appearing. Normal, full affect.  CERVICAL SPINE EXAM Range of motion: Flexion, extension, lateral  bending, and rotation: flexion and extension are relatively preserved with15 of lost lateral bending and moderate loss of rotational movement Pain with terminal motion: yes Spinous Processes: NT SCM: NT Upper paracervical muscles: iffuse pain Upper traps: relatively diffuse, more on the LEFT C5-T1 intact, motor   On the LEFT arm, from the humerus down to the hand decreased sensation pinprick is more noticeable then light touch, but diffusely.  Biceps reflex appears mildly diminished on the LEFT compared to the RIGht.  Radiology: Dg Cervical Spine Complete  04/28/2014   CLINICAL DATA:  Neck pain for 2 weeks with left arm numbness. Initial encounter  EXAM: CERVICAL SPINE  4+ VIEWS  COMPARISON:  None.  FINDINGS: Lower C7 vertebra is partially obscured in the lateral projection.  No evidence of acute fracture, subluxation, or bone lesion. No endplate erosion.  Bulky spondylotic spurring at C4-5, impinging on the hypopharynx. No prominent ossification at the other levels suggestive of diffuse idiopathic skeletal hyperostosis. Uncovertebral spurring on the left at C6-7, with foraminal narrowing. There is no prevertebral swelling.  IMPRESSION: 1.  C6-7 left uncovertebral spur and foraminal narrowing. 2. Bulky anterior osteophyte at C4-5.   Electronically Signed   By: Tiburcio PeaJonathan  Watts M.Blackwell.   On: 04/28/2014 11:07     Assessment and Plan:   Cervical disc disorder with radiculopathy of cervical region - Plan: DG Cervical Spine Complete  Likely combination of osteophyte and discogenic nerve impingement on the LEFT with decreased sensation.  Strength is intact.  Discussed with patient, and for now we will do conservative management with close follow-up.  Check plain films today.  Results above.  Reviewed basic McKenzie protocol and self traction technique.  Follow-up: Return in about 3 weeks (around 05/19/2014).  New Prescriptions   PREDNISONE (DELTASONE) 20 MG TABLET    2 tabs po for 7 days, then 1 tab  po for 7 days   Orders Placed This Encounter  Procedures  . DG Cervical Spine Complete    Signed,  Aleeha Boline T. Cleophas Yoak, MD   Patient's Medications  New Prescriptions   PREDNISONE (DELTASONE) 20 MG TABLET    2 tabs po for 7 days, then 1 tab po for 7 days  Previous Medications   FLUTICASONE (FLONASE) 50 MCG/ACT NASAL SPRAY    Place 2 sprays into both nostrils daily.   LOSARTAN-HYDROCHLOROTHIAZIDE (HYZAAR) 100-25 MG PER TABLET    TAKE 1 TABLET BY MOUTH EVERY DAY  Modified Medications   No medications on file  Discontinued Medications   PREDNISONE (DELTASONE) 10 MG TABLET    Take 3 tabs on days 1-2, take 2 tabs on days 3-4, take 1 tab on days 5-6

## 2014-05-26 ENCOUNTER — Encounter: Payer: Self-pay | Admitting: Family Medicine

## 2014-05-26 ENCOUNTER — Ambulatory Visit (INDEPENDENT_AMBULATORY_CARE_PROVIDER_SITE_OTHER): Payer: Managed Care, Other (non HMO) | Admitting: Family Medicine

## 2014-05-26 VITALS — BP 118/84 | HR 61 | Temp 98.3°F | Wt 233.8 lb

## 2014-05-26 DIAGNOSIS — J020 Streptococcal pharyngitis: Secondary | ICD-10-CM

## 2014-05-26 DIAGNOSIS — J029 Acute pharyngitis, unspecified: Secondary | ICD-10-CM

## 2014-05-26 LAB — POCT RAPID STREP A (OFFICE): Rapid Strep A Screen: POSITIVE — AB

## 2014-05-26 MED ORDER — AMOXICILLIN 875 MG PO TABS: 875.0000 mg | ORAL_TABLET | Freq: Two times a day (BID) | ORAL | Status: AC

## 2014-05-26 NOTE — Patient Instructions (Signed)
Start the amoxil today. Drink plenty of fluids, take tylenol as needed, and gargle with warm salt water for your throat.  This should gradually improve.  Take care.  Let us know if you have other concerns.    

## 2014-05-26 NOTE — Assessment & Plan Note (Signed)
Nontoxic, RST positive. Amoxil, fluids, rest and f/u prn.  He agrees.

## 2014-05-26 NOTE — Progress Notes (Signed)
Pre visit review using our clinic review tool, if applicable. No additional management support is needed unless otherwise documented below in the visit note.  Sx started recently, scratchy throat, then more pain with swallowing.  No fevers.  No vomiting.  Minimal cough.  No facial pain but his eyes and ears feel irritated.  No nausea.  He noted spots on his throat.  No other sick contacts recently, but his daughter had strep about 1 month ago.    Meds, vitals, and allergies reviewed.   ROS: See HPI.  Otherwise, noncontributory.  GEN: nad, alert and oriented HEENT: mucous membranes moist, tm w/ erythema but no exudates, nasal exam w/o erythema, clear discharge noted,  OP with cobblestoning NECK: supple w/o LA CV: rrr.   PULM: ctab, no inc wob EXT: no edema SKIN: no acute rash

## 2014-05-28 ENCOUNTER — Telehealth: Payer: Self-pay

## 2014-05-28 NOTE — Telephone Encounter (Signed)
PLEASE NOTE: All timestamps contained within this report are represented as [place] Standard Time. CONFIDENTIALTY NOTICE: This fax transmission is intended only for the addressee. It contains information that is legally privileged, confidential or otherwise protected from use or disclosure. If you are not the intended recipient, you are strictly prohibited from reviewing, disclosing, copying using or disseminating any of this information or taking any action in reliance on or regarding this information. If you have received this fax in error, please notify us immediately by telephone so that we can arrange for its return to us. Phone: [phone], Toll-Free: [phone], Fax: [phone] Page: 1 of 2 Call Id: [id number] [facility] Client TELEPHONE ADVICE RECORD [facility] Patient Name: Jonathan Blackwell Gender: Male DOB: [birth year] Age: [age] Y 2 M 15 D Return Phone Number: [phone] (Primary) Address: [address] City[place]State/Zip: [place] [address] Client [facility] Contact Type Call Call Type Triage / Clinical Caller Name Chales AbrahamsMary Ann Relationship To Patient Spouse Return Phone Number [phone] (Primary) Chief Complaint Headache Initial Comment Caller states husband was seen on Monday with strep and he thinks he has a sinus infection is head hurts and mucus is lime green. PreDisposition Call Doctor Nurse Assessment Nurse: Anner CreteWells, RN, Olegario MessierKathy Date/Time (Eastern Time): 05/28/2014 9:15:36 AM Confirm and document reason for call. If symptomatic, describe symptoms. ---Caller states husband was seen on Monday with strep and he thinks he may have a sinus infection , his head hurts, sinus pain and mucus is lime green. Has the patient traveled out of the country within the last 30 days? ---Not Applicable Does the patient require triage?  ---Yes Related visit to physician within the last 2 weeks? ---Yes Does the PT have any chronic conditions? (i.e. diabetes, asthma, etc.) ---Yes List chronic conditions. ---HTN Guidelines Guideline Title Affirmed Question Affirmed Notes Nurse Date/Time [name](Eastern Time) Sinus Pain or Congestion [1] Redness or swelling on the cheek, forehead or around the eye AND [2] no fever Anner CreteWells, RN, Olegario MessierKathy 05/28/2014 9:16:38 AM Disp. Time [name](Eastern Time) Disposition Final User 05/28/2014 8:29:29 AM Attempt made - message left Anner CreteWells, RN, Olegario MessierKathy 05/28/2014 9:19:29 AM See Physician within 4 Hours (or PCP triage) Yes Anner CreteWells, RN, Olegario MessierKathy PLEASE NOTE: All timestamps contained within this report are represented as [place] Standard Time. CONFIDENTIALTY NOTICE: This fax transmission is intended only for the addressee. It contains information that is legally privileged, confidential or otherwise protected from use or disclosure. If you are not the intended recipient, you are strictly prohibited from reviewing, disclosing, copying using or disseminating any of this information or taking any action in reliance on or regarding this information. If you have received this fax in error, please notify us immediately by telephone so that we can arrange for its return to us. Phone: [phone], Toll-Free: [phone], Fax: [phone] Page: 2 of 2 Call Id: [id number] Caller Understands: Yes Disagree/Comply: Comply Care Advice Given Per Guideline SEE PHYSICIAN WITHIN 4 HOURS (or PCP triage): * IF NO PCP TRIAGE: You need to be seen. Go to _______________ (ED/UCC or office if it will be open) within the next 3 or 4 hours. Go sooner if you become worse. CALL BACK IF: * You become worse. CARE ADVICE given per Sinus Pain or Congestion (Adult) guideline. After Care Instructions Given Call Event Type User Date / Time Description Referrals REFERRED TO PCP OFFICE

## 2014-05-28 NOTE — Telephone Encounter (Signed)
Left message on voicemail for patient to call back. 

## 2014-05-28 NOTE — Telephone Encounter (Signed)
Patient's wife notified as instructed by telephone. Was advised that patient went to his ENT and is being treated also for a sinus infection.

## 2014-05-28 NOTE — Telephone Encounter (Signed)
His strep test was positive and he is already on amoxil.  I would give that longer to work.  Nasal saline, rest, fluids in the meantime.   Routed to PCP as FYI, and to see if PCP has other input.

## 2014-05-29 ENCOUNTER — Ambulatory Visit: Payer: Managed Care, Other (non HMO) | Admitting: Internal Medicine

## 2014-06-05 ENCOUNTER — Encounter: Payer: Managed Care, Other (non HMO) | Admitting: Family Medicine

## 2014-07-30 ENCOUNTER — Other Ambulatory Visit: Payer: Self-pay | Admitting: Internal Medicine

## 2014-07-31 ENCOUNTER — Telehealth: Payer: Self-pay | Admitting: Family Medicine

## 2014-07-31 NOTE — Telephone Encounter (Signed)
Appointment 3/8 pt aware pt wanted labs same day Please close

## 2014-07-31 NOTE — Telephone Encounter (Signed)
Please call and schedule CPE with fasting labs prior with Dr. Besdole. 

## 2014-08-26 ENCOUNTER — Encounter: Payer: Managed Care, Other (non HMO) | Admitting: Family Medicine

## 2014-09-28 ENCOUNTER — Other Ambulatory Visit: Payer: Self-pay | Admitting: Family Medicine

## 2014-10-03 ENCOUNTER — Ambulatory Visit: Payer: Managed Care, Other (non HMO) | Admitting: Family Medicine

## 2014-10-30 ENCOUNTER — Telehealth: Payer: Self-pay | Admitting: Family Medicine

## 2014-10-30 DIAGNOSIS — E7800 Pure hypercholesterolemia, unspecified: Secondary | ICD-10-CM

## 2014-10-30 DIAGNOSIS — Z125 Encounter for screening for malignant neoplasm of prostate: Secondary | ICD-10-CM

## 2014-10-30 DIAGNOSIS — D696 Thrombocytopenia, unspecified: Secondary | ICD-10-CM

## 2014-10-30 NOTE — Telephone Encounter (Signed)
-----   Message from Alvina Chouerri J Walsh sent at 10/24/2014 10:27 AM EDT ----- Regarding: Lab orders for Friday, 5.13.16 Patient is scheduled for CPX labs, please order future labs, Thanks , Camelia Engerri

## 2014-10-31 ENCOUNTER — Other Ambulatory Visit: Payer: Managed Care, Other (non HMO)

## 2014-11-04 ENCOUNTER — Encounter: Payer: Managed Care, Other (non HMO) | Admitting: Family Medicine

## 2014-11-23 ENCOUNTER — Other Ambulatory Visit: Payer: Self-pay | Admitting: Family Medicine

## 2014-11-23 NOTE — Telephone Encounter (Signed)
Last office visit 10/30/2014 with Dr. Para Marchuncan for Sore Throat.  Last CPE 04/05/2013.  Refill?

## 2014-12-30 ENCOUNTER — Other Ambulatory Visit: Payer: Self-pay | Admitting: Family Medicine

## 2014-12-30 NOTE — Telephone Encounter (Signed)
Please call and schedule CPE with fasting labs prior.  Needs appointment to keep getting refills on his medications.

## 2015-01-01 ENCOUNTER — Encounter: Payer: Self-pay | Admitting: Primary Care

## 2015-01-01 ENCOUNTER — Ambulatory Visit (INDEPENDENT_AMBULATORY_CARE_PROVIDER_SITE_OTHER): Payer: Managed Care, Other (non HMO) | Admitting: Primary Care

## 2015-01-01 VITALS — BP 128/78 | HR 58 | Temp 97.9°F | Ht 71.0 in | Wt 217.8 lb

## 2015-01-01 DIAGNOSIS — J309 Allergic rhinitis, unspecified: Secondary | ICD-10-CM | POA: Diagnosis not present

## 2015-01-01 LAB — POCT RAPID STREP A (OFFICE): Rapid Strep A Screen: NEGATIVE

## 2015-01-01 MED ORDER — FLUTICASONE PROPIONATE 50 MCG/ACT NA SUSP: 2.0000 | Freq: Every day | NASAL | Status: DC

## 2015-01-01 NOTE — Addendum Note (Signed)
Addended by: Tawnya CrookSAMBATH, Aya Geisel on: 01/01/2015 10:46 AM   Modules accepted: Orders

## 2015-01-01 NOTE — Progress Notes (Signed)
Pre visit review using our clinic review tool, if applicable. No additional management support is needed unless otherwise documented below in the visit note. 

## 2015-01-01 NOTE — Patient Instructions (Signed)
Your strep test was negative for bacteria. You may take ibuprofen for pain and inflammation, chloraseptic spray, throat lozenges.  Please call me if you develop fevers or worsening pain in 3-4 days. It was nice to meet you!

## 2015-01-01 NOTE — Progress Notes (Signed)
   Subjective:    Patient ID: Jonathan Blackwell, maleVanessa Blackwell    DOB: 09-06-1960, 54 y.o.   MRN: [id number]  HPI  Jonathan Blackwell is a 54 year old male who presents today with a chief complaint of sore throat. His sore throat suddenly began yesterday evening around 6pm. She reports noticing some white spots to the left posterior aspect of pharynx. Denies fevers, chills, cough, nasal congestion. He's been taking throat lozenges without relief. Nothing makes symptoms better or worse. He reports feeling dizziness with some nausea Sunday morning which passed later that afternoon.  Review of Systems  Constitutional: Negative for fever and chills.  HENT: Positive for sore throat. Negative for congestion, ear pain and rhinorrhea.   Respiratory: Negative for cough and shortness of breath.   Cardiovascular: Negative for chest pain.  Gastrointestinal: Negative for nausea.  Musculoskeletal: Negative for myalgias.       Past Medical History  Diagnosis Date  . HTN (hypertension)   . Allergic rhinitis due to pollen   . ITP (idiopathic thrombocytopenic purpura)     off prednisone since 02/2010    History   Social History  . Marital Status: Married    Spouse Name: N/A  . Number of Children: 3  . 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 Smoking status: Never Smoker   . Smokeless tobacco: Never Used  . Alcohol Use: Yes     Comment: occ wine  . Drug Use: No  . Sexual Activity: Not on file   Other Topics Concern  . Not on file   Social History Narrative    Past Surgical History  Procedure Laterality Date  . Nasal sinus surgery      No family history on file.  No Known Allergies  Current Outpatient Prescriptions on File Prior to Visit  Medication Sig Dispense Refill  . fluticasone (FLONASE) 50 MCG/ACT nasal spray Place 2 sprays into both nostrils daily. PATIENT NEEDS TO SCHEDULE PHYSICAL 16 g 0  . losartan-hydrochlorothiazide  (HYZAAR) 100-25 MG per tablet TAKE 1 TABLET BY MOUTH EVERY DAY 30 tablet 0   No current facility-administered medications on file prior to visit.    BP 128/78 mmHg  Pulse 58  Temp(Src) 97.9 F (36.6 C) (Oral)  Ht 5\' 11"  (1.803 m)  Wt 217 lb 12.8 oz (98.793 kg)  BMI 30.39 kg/m2  SpO2 98%    Objective:   Physical Exam  Constitutional: He appears well-nourished. He does not appear ill.  HENT:  Right Ear: Tympanic membrane and ear canal normal.  Left Ear: Tympanic membrane and ear canal normal.  Nose: Right sinus exhibits no maxillary sinus tenderness and no frontal sinus tenderness. Left sinus exhibits no maxillary sinus tenderness and no frontal sinus tenderness.  Mouth/Throat: Posterior oropharyngeal erythema present. No oropharyngeal exudate or posterior oropharyngeal edema.  Neck: Neck supple.  Cardiovascular: Normal rate and regular rhythm.   Pulmonary/Chest: Effort normal and breath sounds normal.  Lymphadenopathy:    He has no cervical adenopathy.  Skin: Skin is warm and dry.          Assessment & Plan:  Sore throat:  Present since last night. Reports exudate. Denies fevers, cough, nasal congestion. Rapid strep: Negative Treatment with supportive measures. Follow up if development of fevers, or if pain continues or worsens.

## 2015-02-05 ENCOUNTER — Other Ambulatory Visit: Payer: Self-pay | Admitting: Primary Care

## 2015-03-01 ENCOUNTER — Other Ambulatory Visit: Payer: Self-pay | Admitting: Family Medicine

## 2015-03-10 ENCOUNTER — Other Ambulatory Visit: Payer: Self-pay | Admitting: Family Medicine

## 2015-03-10 NOTE — Telephone Encounter (Signed)
Last office visit 01/01/2015 with Jae Dire for sick visit.   Last CPE 04/05/2013.  Patient has cancelled multiple appointments for CPE.  Last month only gave #15 and advised that he needed an appointment.  No future appointments scheduled.  Refill?

## 2015-03-16 ENCOUNTER — Other Ambulatory Visit: Payer: Self-pay | Admitting: Primary Care

## 2015-03-16 NOTE — Telephone Encounter (Signed)
Electronically refill request for fluticasone (FLONASE) 50 MCG/ACT nasal spray.  Last prescribed and seen for acute on 01/01/15. Patient cancel last CPE appt on 11/04/14. No future appt.

## 2015-03-28 ENCOUNTER — Other Ambulatory Visit: Payer: Self-pay | Admitting: Family Medicine

## 2015-04-28 ENCOUNTER — Other Ambulatory Visit: Payer: Self-pay | Admitting: Family Medicine

## 2015-05-05 ENCOUNTER — Other Ambulatory Visit: Payer: Self-pay | Admitting: Family Medicine

## 2015-06-10 ENCOUNTER — Encounter: Payer: Self-pay | Admitting: Family Medicine

## 2015-06-10 ENCOUNTER — Ambulatory Visit (INDEPENDENT_AMBULATORY_CARE_PROVIDER_SITE_OTHER): Payer: Managed Care, Other (non HMO) | Admitting: Family Medicine

## 2015-06-10 DIAGNOSIS — J029 Acute pharyngitis, unspecified: Secondary | ICD-10-CM | POA: Diagnosis not present

## 2015-06-10 LAB — POCT RAPID STREP A (OFFICE): RAPID STREP A SCREEN: NEGATIVE

## 2015-06-10 NOTE — Assessment & Plan Note (Signed)
New problem. Rapid strep negative today. Advised him that this is likely viral in origin. Treating with over-the-counter ibuprofen or tylenol and an antihistamine.

## 2015-06-10 NOTE — Progress Notes (Signed)
   Subjective:  Patient ID: Jonathan Blackwell, male    DOB: 1961-04-24  Age: 54 y.o. MRN: [id number]  CC: Sore throat  HPI:  54 year old male presents to the clinic today with complaints of sore throat.  Patient reports that he's had sore throat for the past few days. No associated fevers or chills. No other URI symptoms. He's been taking over-the-counter Tylenol with little improvement. No known sick contacts. Patient has a history of strep throat and wanted to be seen before the holidays to ensure that he did not have strep throat.  Social Hx   Social History   Social History  . Marital Status: Married    Spouse Name: N/A  . Number of Children: 3  . 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 Smoking status: Never Smoker   . Smokeless tobacco: Never Used  . Alcohol Use: Yes     Comment: occ wine  . Drug Use: No  . Sexual Activity: Not Asked   Other Topics Concern  . None   Social History Narrative    Review of Systems  Constitutional: Negative for fever.  HENT: Positive for sore throat.    Objective:  BP 164/108 mmHg  Pulse 66  Temp(Src) 97.5 F (36.4 C) (Oral)  Ht 5\' 11"  (1.803 m)  Wt 231 lb 2 oz (104.838 kg)  BMI 32.25 kg/m2  SpO2 96%  BP/Weight 06/10/2015 01/01/2015 05/26/2014  Systolic BP 164 128 118  Diastolic BP 108 78 84  Wt. (Lbs) 231.13 217.8 233.75  BMI 32.25 30.39 32.62   Physical Exam  Constitutional: He appears well-developed. No distress.  HENT:  Head: Normocephalic and atraumatic.  TM's normal bilaterally. Oropharynx with mild erythema and evidence of postnasal drip.  Eyes: Conjunctivae are normal.  Neck: Neck supple.  Cardiovascular: Normal rate and regular rhythm.   No murmur heard. Pulmonary/Chest: Effort normal and breath sounds normal. No respiratory distress. He has no wheezes. He has no rales.  Lymphadenopathy:    He has no cervical adenopathy.  Neurological: He is  alert.  Vitals reviewed.  Lab Results  Component Value Date   WBC 7.9 04/01/2013   HGB 16.5 04/01/2013   HCT 48.2 04/01/2013   PLT 193.0 04/01/2013   GLUCOSE 91 04/01/2013   CHOL 213* 04/01/2013   TRIG 109.0 04/01/2013   HDL 42.70 04/01/2013   LDLDIRECT 154.2 04/01/2013   LDLCALC 133* 05/01/2009   ALT 76* 04/15/2013   AST 41* 04/15/2013   NA 138 04/01/2013   K 3.6 04/01/2013   CL 98 04/01/2013   CREATININE 1.2 04/01/2013   BUN 18 04/01/2013   CO2 30 04/01/2013   PSA 0.51 04/01/2013   INR 0.99 09/01/2009   Assessment & Plan:   Problem List Items Addressed This Visit    Acute pharyngitis    New problem. Rapid strep negative today. Advised him that this is likely viral in origin. Treating with over-the-counter ibuprofen or tylenol and an antihistamine.      Relevant Orders   POCT rapid strep A (Completed)     Follow-up: Return if symptoms worsen or fail to improve.  Everlene OtherJayce Smt. Loder DO [facility] [place]

## 2015-06-10 NOTE — Progress Notes (Signed)
Pre visit review using our clinic review tool, if applicable. No additional management support is needed unless otherwise documented below in the visit note. 

## 2015-06-10 NOTE — Patient Instructions (Signed)
It was nice to see you today.  Strep test was negative.  This is likely viral and from post nasal drip.  Continue OTC tylenol/ibuprofen.   An antihistamine may help.  Take care and merry Christmas.  Dr. Adriana Simasook

## 2015-06-16 ENCOUNTER — Other Ambulatory Visit: Payer: Self-pay | Admitting: Family Medicine

## 2015-06-16 ENCOUNTER — Telehealth: Payer: Self-pay | Admitting: Family Medicine

## 2015-06-16 NOTE — Telephone Encounter (Signed)
Please advise 

## 2015-06-16 NOTE — Telephone Encounter (Signed)
[facility]

## 2015-06-16 NOTE — Telephone Encounter (Signed)
Pt called about needing a refill for losartan-hydrochlorothiazide (HYZAAR) 100-25 MG per tablet. Pharmacy is [id number] [address] [place], [place] [phone] Thank You!

## 2015-06-17 ENCOUNTER — Other Ambulatory Visit: Payer: Self-pay

## 2015-06-17 MED ORDER — LOSARTAN POTASSIUM-HCTZ 100-25 MG PO TABS: 1.0000 | ORAL_TABLET | Freq: Every day | ORAL | Status: DC

## 2015-06-17 NOTE — Telephone Encounter (Signed)
Pt called re; refill of losartan; advised pt has not had annual exam with Dr Ermalene SearingBedsole since 2014 and has had several cancellations and one no show for appts. Pt is out of losartan and pt said he no longer sees Dr Ermalene SearingBedsole; that [facility]. Transferred pt to  [facility] [place];spoke with Kenney Housemananya RN and she will speak with pt.call transferred.

## 2015-06-17 NOTE — Telephone Encounter (Signed)
Please advise refill on your patient.  Thanks, Kenney Housemananya, RN

## 2015-06-23 ENCOUNTER — Ambulatory Visit (INDEPENDENT_AMBULATORY_CARE_PROVIDER_SITE_OTHER): Payer: Managed Care, Other (non HMO) | Admitting: Family Medicine

## 2015-06-23 ENCOUNTER — Encounter: Payer: Self-pay | Admitting: Family Medicine

## 2015-06-23 VITALS — BP 124/80 | HR 59 | Temp 98.0°F | Ht 71.0 in | Wt 225.8 lb

## 2015-06-23 DIAGNOSIS — J029 Acute pharyngitis, unspecified: Secondary | ICD-10-CM | POA: Diagnosis not present

## 2015-06-23 MED ORDER — AZITHROMYCIN 250 MG PO TABS: ORAL_TABLET | ORAL | Status: DC

## 2015-06-23 NOTE — Progress Notes (Signed)
   Subjective:  Patient ID: Jonathan Blackwell, male    DOB: 07-Aug-1960  Age: 55 y.o. MRN: [id number]  CC: Sore throat  HPI:  55 year old male presents for acute visit with complaints of sore throat.  Patient was seen previously for this on 12/21.  Since that time he's had no improvement with conservative care. He's been taking Tylenol and using Flonase with no relief. He states it is particularly bothersome in the morning. No associated fevers or chills. No other associated URI symptoms.   Social Hx   Social History   Social History  . Marital Status: Married    Spouse Name: N/A  . Number of Children: 3  . 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 Smoking status: Never Smoker   . Smokeless tobacco: Never Used  . Alcohol Use: Yes     Comment: occ wine  . Drug Use: No  . Sexual Activity: Not Asked   Other Topics Concern  . None   Social History Narrative   Review of Systems  Constitutional: Negative for fever.  HENT: Positive for sore throat.    Objective:  BP 124/80 mmHg  Pulse 59  Temp(Src) 98 F (36.7 C) (Oral)  Ht 5\' 11"  (1.803 m)  Wt 225 lb 12.8 oz (102.422 kg)  BMI 31.51 kg/m2  SpO2 97%  BP/Weight 06/23/2015 06/10/2015 01/01/2015  Systolic BP 124 164 128  Diastolic BP 80 108 78  Wt. (Lbs) 225.8 231.13 217.8  BMI 31.51 32.25 30.39   Physical Exam  Constitutional: He is oriented to person, place, and time. He appears well-developed. No distress.  HENT:  Head: Normocephalic and atraumatic.  Right Ear: External ear normal.  Left Ear: External ear normal.  Mouth/Throat: No oropharyngeal exudate.  Normal TM's bilaterally.  Eyes: Conjunctivae are normal.  Neck: Neck supple.  Cardiovascular: Normal rate and regular rhythm.   No murmur heard. Pulmonary/Chest: Breath sounds normal. No respiratory distress. He has no wheezes. He has no rales.  Neurological: He is alert and oriented to person,  place, and time.  Psychiatric: He has a normal mood and affect.  Vitals reviewed.  Lab Results  Component Value Date   WBC 7.9 04/01/2013   HGB 16.5 04/01/2013   HCT 48.2 04/01/2013   PLT 193.0 04/01/2013   GLUCOSE 91 04/01/2013   CHOL 213* 04/01/2013   TRIG 109.0 04/01/2013   HDL 42.70 04/01/2013   LDLDIRECT 154.2 04/01/2013   LDLCALC 133* 05/01/2009   ALT 76* 04/15/2013   AST 41* 04/15/2013   NA 138 04/01/2013   K 3.6 04/01/2013   CL 98 04/01/2013   CREATININE 1.2 04/01/2013   BUN 18 04/01/2013   CO2 30 04/01/2013   PSA 0.51 04/01/2013   INR 0.99 09/01/2009    Assessment & Plan:   Problem List Items Addressed This Visit    Acute pharyngitis - Primary    Established problem. No improvement with supportive care. Has been persistent for the past 2-3 weeks. Given duration of illness, treating for atypical pathogens with azithromycin.         Meds ordered this encounter  Medications  . azithromycin (ZITHROMAX) 250 MG tablet    Sig: 2 tablets on Day 1, then 1 tablet daily on Days 2-5.    Dispense:  6 tablet    Refill:  0    Follow-up: PRN  Everlene OtherJayce Jerron Niblack DO [facility] [place]

## 2015-06-23 NOTE — Assessment & Plan Note (Signed)
Established problem. No improvement with supportive care. Has been persistent for the past 2-3 weeks. Given duration of illness, treating for atypical pathogens with azithromycin.

## 2015-06-23 NOTE — Progress Notes (Signed)
Pre visit review using our clinic review tool, if applicable. No additional management support is needed unless otherwise documented below in the visit note. 

## 2015-06-23 NOTE — Patient Instructions (Signed)
Take the Azithromycin as prescribed.  Follow up for an annual physical later this year.  Take care  Dr Adriana Simasook

## 2015-07-06 ENCOUNTER — Encounter: Payer: Managed Care, Other (non HMO) | Admitting: Family Medicine

## 2015-07-13 ENCOUNTER — Telehealth: Payer: Self-pay | Admitting: Surgical

## 2015-07-13 ENCOUNTER — Other Ambulatory Visit: Payer: Self-pay | Admitting: Family Medicine

## 2015-07-13 NOTE — Telephone Encounter (Signed)
Left message on patients voicemail for him to return call to schedule appointment. We have refilled his Hyzaar for 30 days, but patient will need appointment to establish care per Dr. Adriana Simas. Patient did not show for his appointment on 07/06/15.

## 2015-08-22 ENCOUNTER — Other Ambulatory Visit: Payer: Self-pay | Admitting: Family Medicine

## 2016-03-21 ENCOUNTER — Other Ambulatory Visit: Payer: Self-pay | Admitting: Family Medicine

## 2016-06-24 ENCOUNTER — Ambulatory Visit (INDEPENDENT_AMBULATORY_CARE_PROVIDER_SITE_OTHER): Payer: Managed Care, Other (non HMO) | Admitting: Family Medicine

## 2016-06-24 ENCOUNTER — Encounter: Payer: Self-pay | Admitting: Family Medicine

## 2016-06-24 VITALS — BP 159/89 | HR 53 | Temp 97.7°F | Resp 14 | Wt 245.8 lb

## 2016-06-24 DIAGNOSIS — J029 Acute pharyngitis, unspecified: Secondary | ICD-10-CM | POA: Diagnosis not present

## 2016-06-24 LAB — POCT RAPID STREP A (OFFICE): Rapid Strep A Screen: NEGATIVE

## 2016-06-24 MED ORDER — FLUTICASONE PROPIONATE 50 MCG/ACT NA SUSP: NASAL | 6 refills | Status: DC

## 2016-06-24 NOTE — Progress Notes (Signed)
Pre visit review using our clinic review tool, if applicable. No additional management support is needed unless otherwise documented below in the visit note. 

## 2016-06-24 NOTE — Assessment & Plan Note (Signed)
New acute problem. Rapid strep negative. He appears to be viral. Supportive care. Flonase and over-the-counter ibuprofen.

## 2016-06-24 NOTE — Patient Instructions (Signed)
This is viral.  Flonase, tylenol, motrin.  Take care  Dr. Adriana Simasook

## 2016-06-24 NOTE — Progress Notes (Signed)
   Subjective:  Patient ID: Vanessa BarbaraMichael D Mauzy, male    DOB: 1961/06/05  Age: 56 y.o. MRN: [id number]  CC: Sore throat  HPI:  56 year old male presents with complaints of sore throat.  Sore throat  X 3 days.  Moderate in severity  Reports sore throat only. No other respiratory symptoms.  No fever or chills.  He's taken some Motrin and Flonase without improvement.  Reports difficulty swallowing.  Has had a positive sick contact in his son.  Social Hx   Social History   Social History  . Marital status: Married    Spouse name: N/A  . Number of children: 3  . 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 Smoking status: Never Smoker  . Smokeless tobacco: Never Used  . Alcohol use Yes     Comment: occ wine  . Drug use: No  . Sexual activity: Not Asked   Other Topics Concern  . None   Social History Narrative  . None    Review of Systems  Constitutional: Negative for fever.  HENT: Positive for sore throat.    Objective:  BP (!) 159/89   Pulse (!) 53   Temp 97.7 F (36.5 C) (Oral)   Resp 14   Wt 245 lb 12.8 oz (111.5 kg)   SpO2 98%   BMI 34.28 kg/m   BP/Weight 06/24/2016 06/23/2015 06/10/2015  Systolic BP 159 124 164  Diastolic BP 89 80 108  Wt. (Lbs) 245.8 225.8 231.13  BMI 34.28 31.51 32.25   Physical Exam  Constitutional: He is oriented to person, place, and time. He appears well-developed. No distress.  HENT:  Mouth/Throat: Oropharynx is clear and moist.  Cardiovascular: Normal rate and regular rhythm.   Pulmonary/Chest: Effort normal and breath sounds normal.  Neurological: He is alert and oriented to person, place, and time.  Psychiatric: He has a normal mood and affect.  Vitals reviewed.  Lab Results  Component Value Date   WBC 7.9 04/01/2013   HGB 16.5 04/01/2013   HCT 48.2 04/01/2013   PLT 193.0 04/01/2013   GLUCOSE 91 04/01/2013   CHOL 213 (H) 04/01/2013   TRIG 109.0  04/01/2013   HDL 42.70 04/01/2013   LDLDIRECT 154.2 04/01/2013   LDLCALC 133 (H) 05/01/2009   ALT 76 (H) 04/15/2013   AST 41 (H) 04/15/2013   NA 138 04/01/2013   K 3.6 04/01/2013   CL 98 04/01/2013   CREATININE 1.2 04/01/2013   BUN 18 04/01/2013   CO2 30 04/01/2013   PSA 0.51 04/01/2013   INR 0.99 09/01/2009   Assessment & Plan:   Problem List Items Addressed This Visit    Viral pharyngitis - Primary    New acute problem. Rapid strep negative. He appears to be viral. Supportive care. Flonase and over-the-counter ibuprofen.      Relevant Orders   POCT rapid strep A (Completed)     Meds ordered this encounter  Medications  . fluticasone (FLONASE) 50 MCG/ACT nasal spray    Sig: PLACE 2 SPRAYS INTO BOTH NOSTRILS DAILY.    Dispense:  16 g    Refill:  6    Follow-up: PRN  Everlene OtherJayce Keyana Guevara DO [facility] [place]

## 2016-07-22 ENCOUNTER — Encounter: Payer: Self-pay | Admitting: Family Medicine

## 2016-07-22 ENCOUNTER — Ambulatory Visit (INDEPENDENT_AMBULATORY_CARE_PROVIDER_SITE_OTHER): Payer: Managed Care, Other (non HMO) | Admitting: Family Medicine

## 2016-07-22 DIAGNOSIS — J209 Acute bronchitis, unspecified: Secondary | ICD-10-CM | POA: Insufficient documentation

## 2016-07-22 MED ORDER — PREDNISONE 50 MG PO TABS
ORAL_TABLET | ORAL | 0 refills | Status: DC
Start: 2016-07-22 — End: 2016-08-04

## 2016-07-22 MED ORDER — HYDROCOD POLST-CPM POLST ER 10-8 MG/5ML PO SUER: 5.0000 mL | Freq: Two times a day (BID) | ORAL | 0 refills | Status: DC | PRN

## 2016-07-22 NOTE — Progress Notes (Signed)
   Subjective:  Patient ID: Jonathan Blackwell, male    DOB: Feb 22, 1961  Age: 56 y.o. MRN: [id number]  CC: Cough  HPI:  56 year old male presents for an acute visit with complaints of cough.  Patient was recently seen in January for pharyngitis. He reports he has had a cough for 3 weeks. Nonproductive. Nagging. Moderate to severe. No associated fevers or chills. No shortness of breath. He's been using Robitussin with some improvement. No other associated symptoms. No known exacerbating factors. No other complaints or concerns at this time.  Social Hx   Social History   Social History  . Marital status: Married    Spouse name: N/A  . Number of children: 3  . 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 Smoking status: Never Smoker  . Smokeless tobacco: Never Used  . Alcohol use Yes     Comment: occ wine  . Drug use: No  . Sexual activity: Not Asked   Other Topics Concern  . None   Social History Narrative  . None   Review of Systems  Constitutional: Negative for fever.  Respiratory: Positive for cough and chest tightness. Negative for shortness of breath and wheezing.    Objective:  BP (!) 158/95   Pulse 66   Temp 98.7 F (37.1 C) (Oral)   Ht 5\' 11"  (1.803 m)   Wt 238 lb 3.2 oz (108 kg)   SpO2 97%   BMI 33.22 kg/m   BP/Weight 07/22/2016 06/24/2016 06/23/2015  Systolic BP 158 159 124  Diastolic BP 95 89 80  Wt. (Lbs) 238.2 245.8 225.8  BMI 33.22 34.28 31.51   Physical Exam  Constitutional: He is oriented to person, place, and time. He appears well-developed. No distress.  HENT:  Mouth/Throat: Oropharynx is clear and moist.  Cardiovascular: Normal rate and regular rhythm.   Pulmonary/Chest: Effort normal and breath sounds normal.  Neurological: He is alert and oriented to person, place, and time.  Psychiatric: He has a normal mood and affect.  Vitals reviewed.  Lab Results  Component Value Date   WBC 7.9 04/01/2013   HGB 16.5 04/01/2013   HCT 48.2 04/01/2013   PLT 193.0 04/01/2013   GLUCOSE 91 04/01/2013   CHOL 213 (H) 04/01/2013   TRIG 109.0 04/01/2013   HDL 42.70 04/01/2013   LDLDIRECT 154.2 04/01/2013   LDLCALC 133 (H) 05/01/2009   ALT 76 (H) 04/15/2013   AST 41 (H) 04/15/2013   NA 138 04/01/2013   K 3.6 04/01/2013   CL 98 04/01/2013   CREATININE 1.2 04/01/2013   BUN 18 04/01/2013   CO2 30 04/01/2013   PSA 0.51 04/01/2013   INR 0.99 09/01/2009   Assessment & Plan:   Problem List Items Addressed This Visit    Acute bronchitis    New acute problem. Treating with prednisone and tussionex.         Meds ordered this encounter  Medications  . predniSONE (DELTASONE) 50 MG tablet    Sig: 1 tablet daily x 5 days.    Dispense:  5 tablet    Refill:  0  . chlorpheniramine-HYDROcodone (TUSSIONEX PENNKINETIC ER) 10-8 MG/5ML SUER    Sig: Take 5 mLs by mouth every 12 (twelve) hours as needed.    Dispense:  115 mL    Refill:  0   Follow-up: PRN  Everlene OtherJayce Chala Gul DO [facility] [place]

## 2016-07-22 NOTE — Patient Instructions (Signed)

## 2016-07-22 NOTE — Assessment & Plan Note (Signed)
New acute problem. Treating with prednisone and tussionex.

## 2016-07-22 NOTE — Progress Notes (Signed)
Pre visit review using our clinic review tool, if applicable. No additional management support is needed unless otherwise documented below in the visit note. 

## 2016-08-04 ENCOUNTER — Ambulatory Visit (INDEPENDENT_AMBULATORY_CARE_PROVIDER_SITE_OTHER): Payer: Managed Care, Other (non HMO) | Admitting: Family Medicine

## 2016-08-04 DIAGNOSIS — J209 Acute bronchitis, unspecified: Secondary | ICD-10-CM | POA: Diagnosis not present

## 2016-08-04 MED ORDER — DOXYCYCLINE HYCLATE 100 MG PO TABS: 100.0000 mg | ORAL_TABLET | Freq: Two times a day (BID) | ORAL | 0 refills | Status: DC

## 2016-08-04 MED ORDER — BENZONATATE 100 MG PO CAPS: 100.0000 mg | ORAL_CAPSULE | Freq: Three times a day (TID) | ORAL | 0 refills | Status: DC | PRN

## 2016-08-04 NOTE — Progress Notes (Signed)
Pre visit review using our clinic review tool, if applicable. No additional management support is needed unless otherwise documented below in the visit note. 

## 2016-08-04 NOTE — Progress Notes (Signed)
Subjective:  Patient ID: Jonathan Blackwell, male    DOB: 08-29-1960  Age: 56 y.o. MRN: [id number]  CC: Cough  HPI:  56 year old male with hypertension and hyperlipidemia presents with continued complaints of cough.  Patient was recently seen on 2/2 was diagnosed with acute bronchitis. He was treated with prednisone and Tussionex. He has had little improvement. He did not tolerate the Tussionex as he had an allergic response (itching). 10 use to have severe cough, particularly at night. He reports associated postnasal drip. On Tuesday night he had a episode where he passed out after a severe coughing spell. No associated fevers or chills. No shortness of breath. No other associated symptoms. No other complaints or concerns at this time.  Social Hx   Social History   Social History  . Marital status: Married    Spouse name: N/A  . Number of children: 3  . 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 Smoking status: Never Smoker  . Smokeless tobacco: Never Used  . Alcohol use Yes     Comment: occ wine  . Drug use: No  . Sexual activity: Not on file   Other Topics Concern  . Not on file   Social History Narrative  . No narrative on file    Review of Systems  Constitutional: Negative.   Respiratory: Positive for cough. Negative for shortness of breath.    Objective:  BP 121/84   Pulse 67   Temp 97.6 F (36.4 C) (Oral)   Wt 233 lb 12.8 oz (106.1 kg)   SpO2 98%   BMI 32.61 kg/m   BP/Weight 08/04/2016 07/22/2016 06/24/2016  Systolic BP 121 158 159  Diastolic BP 84 95 89  Wt. (Lbs) 233.8 238.2 245.8  BMI 32.61 33.22 34.28   Physical Exam  Constitutional: He is oriented to person, place, and time. He appears well-developed. No distress.  HENT:  Mouth/Throat: Oropharynx is clear and moist.  Cardiovascular: Normal rate and regular rhythm.   Pulmonary/Chest: Effort normal and breath sounds normal.    Neurological: He is alert and oriented to person, place, and time.  Psychiatric: He has a normal mood and affect.  Vitals reviewed.  Lab Results  Component Value Date   WBC 7.9 04/01/2013   HGB 16.5 04/01/2013   HCT 48.2 04/01/2013   PLT 193.0 04/01/2013   GLUCOSE 91 04/01/2013   CHOL 213 (H) 04/01/2013   TRIG 109.0 04/01/2013   HDL 42.70 04/01/2013   LDLDIRECT 154.2 04/01/2013   LDLCALC 133 (H) 05/01/2009   ALT 76 (H) 04/15/2013   AST 41 (H) 04/15/2013   NA 138 04/01/2013   K 3.6 04/01/2013   CL 98 04/01/2013   CREATININE 1.2 04/01/2013   BUN 18 04/01/2013   CO2 30 04/01/2013   PSA 0.51 04/01/2013   INR 0.99 09/01/2009    Assessment & Plan:   Problem List Items Addressed This Visit    Acute bronchitis    Established problem, worsening. I still feel that his clinical picture is secondary to bronchitis. Treating with doxycycline given persistence of symptoms. Tessalon for cough. No indication for imaging at this time.         Meds ordered this encounter  Medications  . doxycycline (VIBRA-TABS) 100 MG tablet    Sig: Take 1 tablet (100 mg total) by mouth 2 (two) times daily.    Dispense:  20 tablet    Refill:  0  . benzonatate (TESSALON) 100 MG capsule    Sig: Take 1 capsule (100 mg total) by mouth 3 (three) times daily as needed.    Dispense:  30 capsule    Refill:  0    Follow-up: PRN  Everlene Other DO [facility]

## 2016-08-04 NOTE — Patient Instructions (Addendum)
This is quite typical of bronchitis (cough can last quite a long time).  Given your continued symptoms, I am going to treat you with an antibiotic.  Take it as prescribed.  Tessalon as needed for cough.  Call if you fail to improve or worsen.  Take care  Dr. Adriana Simasook

## 2016-08-04 NOTE — Assessment & Plan Note (Signed)
Established problem, worsening. I still feel that his clinical picture is secondary to bronchitis. Treating with doxycycline given persistence of symptoms. Tessalon for cough. No indication for imaging at this time.

## 2016-10-22 ENCOUNTER — Other Ambulatory Visit: Payer: Self-pay | Admitting: Family Medicine

## 2016-12-07 ENCOUNTER — Other Ambulatory Visit: Payer: Self-pay | Admitting: Family Medicine

## 2017-04-27 ENCOUNTER — Other Ambulatory Visit: Payer: Self-pay | Admitting: Family Medicine

## 2017-07-15 ENCOUNTER — Other Ambulatory Visit: Payer: Self-pay | Admitting: Family Medicine

## 2017-09-28 ENCOUNTER — Ambulatory Visit (INDEPENDENT_AMBULATORY_CARE_PROVIDER_SITE_OTHER): Payer: BLUE CROSS/BLUE SHIELD | Admitting: Internal Medicine

## 2017-09-28 ENCOUNTER — Encounter: Payer: Self-pay | Admitting: Internal Medicine

## 2017-09-28 ENCOUNTER — Ambulatory Visit (INDEPENDENT_AMBULATORY_CARE_PROVIDER_SITE_OTHER): Payer: BLUE CROSS/BLUE SHIELD

## 2017-09-28 VITALS — BP 154/96 | HR 77 | Temp 98.3°F | Ht 71.0 in | Wt 242.7 lb

## 2017-09-28 DIAGNOSIS — Z125 Encounter for screening for malignant neoplasm of prostate: Secondary | ICD-10-CM | POA: Diagnosis not present

## 2017-09-28 DIAGNOSIS — Z23 Encounter for immunization: Secondary | ICD-10-CM

## 2017-09-28 DIAGNOSIS — Z0184 Encounter for antibody response examination: Secondary | ICD-10-CM

## 2017-09-28 DIAGNOSIS — Z1389 Encounter for screening for other disorder: Secondary | ICD-10-CM

## 2017-09-28 DIAGNOSIS — M79641 Pain in right hand: Secondary | ICD-10-CM

## 2017-09-28 DIAGNOSIS — S6991XA Unspecified injury of right wrist, hand and finger(s), initial encounter: Secondary | ICD-10-CM | POA: Diagnosis not present

## 2017-09-28 DIAGNOSIS — Z0000 Encounter for general adult medical examination without abnormal findings: Secondary | ICD-10-CM

## 2017-09-28 DIAGNOSIS — M25522 Pain in left elbow: Secondary | ICD-10-CM

## 2017-09-28 DIAGNOSIS — Z1329 Encounter for screening for other suspected endocrine disorder: Secondary | ICD-10-CM

## 2017-09-28 DIAGNOSIS — K760 Fatty (change of) liver, not elsewhere classified: Secondary | ICD-10-CM | POA: Insufficient documentation

## 2017-09-28 DIAGNOSIS — Z1159 Encounter for screening for other viral diseases: Secondary | ICD-10-CM

## 2017-09-28 DIAGNOSIS — I10 Essential (primary) hypertension: Secondary | ICD-10-CM | POA: Diagnosis not present

## 2017-09-28 LAB — CBC WITH DIFFERENTIAL/PLATELET
BASOS ABS: 0 10*3/uL (ref 0.0–0.1)
Basophils Relative: 0.5 % (ref 0.0–3.0)
Eosinophils Absolute: 0.2 10*3/uL (ref 0.0–0.7)
Eosinophils Relative: 4 % (ref 0.0–5.0)
HCT: 45.2 % (ref 39.0–52.0)
Hemoglobin: 15.7 g/dL (ref 13.0–17.0)
LYMPHS ABS: 1.5 10*3/uL (ref 0.7–4.0)
Lymphocytes Relative: 29.7 % (ref 12.0–46.0)
MCHC: 34.8 g/dL (ref 30.0–36.0)
MCV: 91.5 fl (ref 78.0–100.0)
MONOS PCT: 8 % (ref 3.0–12.0)
Monocytes Absolute: 0.4 10*3/uL (ref 0.1–1.0)
NEUTROS ABS: 2.9 10*3/uL (ref 1.4–7.7)
NEUTROS PCT: 57.8 % (ref 43.0–77.0)
PLATELETS: 161 10*3/uL (ref 150.0–400.0)
RBC: 4.94 Mil/uL (ref 4.22–5.81)
RDW: 13.5 % (ref 11.5–15.5)
WBC: 5.1 10*3/uL (ref 4.0–10.5)

## 2017-09-28 LAB — URINALYSIS, ROUTINE W REFLEX MICROSCOPIC
BILIRUBIN URINE: NEGATIVE
HGB URINE DIPSTICK: NEGATIVE
KETONES UR: NEGATIVE
LEUKOCYTES UA: NEGATIVE
NITRITE: NEGATIVE
PH: 6 (ref 5.0–8.0)
RBC / HPF: NONE SEEN (ref 0–?)
Specific Gravity, Urine: 1.015 (ref 1.000–1.030)
Total Protein, Urine: NEGATIVE
Urine Glucose: NEGATIVE
Urobilinogen, UA: 0.2 (ref 0.0–1.0)

## 2017-09-28 LAB — COMPREHENSIVE METABOLIC PANEL
ALK PHOS: 51 U/L (ref 39–117)
ALT: 84 U/L — AB (ref 0–53)
AST: 29 U/L (ref 0–37)
Albumin: 4.7 g/dL (ref 3.5–5.2)
BILIRUBIN TOTAL: 1.2 mg/dL (ref 0.2–1.2)
BUN: 14 mg/dL (ref 6–23)
CALCIUM: 9.6 mg/dL (ref 8.4–10.5)
CO2: 29 meq/L (ref 19–32)
CREATININE: 1.09 mg/dL (ref 0.40–1.50)
Chloride: 101 mEq/L (ref 96–112)
GFR: 74.5 mL/min (ref >60.00)
GLUCOSE: 101 mg/dL — AB (ref 70–99)
Potassium: 3.8 mEq/L (ref 3.5–5.1)
Sodium: 139 mEq/L (ref 135–145)
TOTAL PROTEIN: 6.8 g/dL (ref 6.0–8.3)

## 2017-09-28 LAB — T4, FREE: FREE T4: 0.83 ng/dL (ref 0.60–1.60)

## 2017-09-28 LAB — LIPID PANEL
CHOLESTEROL: 217 mg/dL — AB (ref 0–200)
HDL: 47.6 mg/dL (ref >39.00)
LDL Cholesterol: 144 mg/dL — ABNORMAL HIGH (ref 0–99)
NonHDL: 169.71
Total CHOL/HDL Ratio: 5
Triglycerides: 128 mg/dL (ref 0.0–149.0)
VLDL: 25.6 mg/dL (ref 0.0–40.0)

## 2017-09-28 LAB — PSA: PSA: 0.61 ng/mL (ref 0.10–4.00)

## 2017-09-28 LAB — TSH: TSH: 2.6 u[IU]/mL (ref 0.35–4.50)

## 2017-09-28 NOTE — Patient Instructions (Addendum)
We will do physical at follow up in 2 weeks    Colonoscopy, Adult A colonoscopy is an exam to look at the entire large intestine. During the exam, a lubricated, bendable tube is inserted into the anus and then passed into the rectum, colon, and other parts of the large intestine. A colonoscopy is often done as a part of normal colorectal screening or in response to certain symptoms, such as anemia, persistent diarrhea, abdominal pain, and blood in the stool. The exam can help screen for and diagnose medical problems, including:  Tumors.  Polyps.  Inflammation.  Areas of bleeding.  Tell a health care provider about:  Any allergies you have.  All medicines you are taking, including vitamins, herbs, eye drops, creams, and over-the-counter medicines.  Any problems you or family members have had with anesthetic medicines.  Any blood disorders you have.  Any surgeries you have had.  Any medical conditions you have.  Any problems you have had passing stool. What are the risks? Generally, this is a safe procedure. However, problems may occur, including:  Bleeding.  A tear in the intestine.  A reaction to medicines given during the exam.  Infection (rare).  What happens before the procedure? Eating and drinking restrictions Follow instructions from your health care provider about eating and drinking, which may include:  A few days before the procedure - follow a low-fiber diet. Avoid nuts, seeds, dried fruit, raw fruits, and vegetables.  1-3 days before the procedure - follow a clear liquid diet. Drink only clear liquids, such as clear broth or bouillon, black coffee or tea, clear juice, clear soft drinks or sports drinks, gelatin dessert, and popsicles. Avoid any liquids that contain red or purple dye.  On the day of the procedure - do not eat or drink anything during the 2 hours before the procedure, or within the time period that your health care provider  recommends.  Bowel prep If you were prescribed an oral bowel prep to clean out your colon:  Take it as told by your health care provider. Starting the day before your procedure, you will need to drink a large amount of medicated liquid. The liquid will cause you to have multiple loose stools until your stool is almost clear or light green.  If your skin or anus gets irritated from diarrhea, you may use these to relieve the irritation: ? Medicated wipes, such as adult wet wipes with aloe and vitamin E. ? A skin soothing-product like petroleum jelly.  If you vomit while drinking the bowel prep, take a break for up to 60 minutes and then begin the bowel prep again. If vomiting continues and you cannot take the bowel prep without vomiting, call your health care provider.  General instructions  Ask your health care provider about changing or stopping your regular medicines. This is especially important if you are taking diabetes medicines or blood thinners.  Plan to have someone take you home from the hospital or clinic. What happens during the procedure?  An IV tube may be inserted into one of your veins.  You will be given medicine to help you relax (sedative).  To reduce your risk of infection: ? Your health care team will wash or sanitize their hands. ? Your anal area will be washed with soap.  You will be asked to lie on your side with your knees bent.  Your health care provider will lubricate a long, thin, flexible tube. The tube will have a camera and  a light on the end.  The tube will be inserted into your anus.  The tube will be gently eased through your rectum and colon.  Air will be delivered into your colon to keep it open. You may feel some pressure or cramping.  The camera will be used to take images during the procedure.  A small tissue sample may be removed from your body to be examined under a microscope (biopsy). If any potential problems are found, the tissue  will be sent to a lab for testing.  If small polyps are found, your health care provider may remove them and have them checked for cancer cells.  The tube that was inserted into your anus will be slowly removed. The procedure may vary among health care providers and hospitals. What happens after the procedure?  Your blood pressure, heart rate, breathing rate, and blood oxygen level will be monitored until the medicines you were given have worn off.  Do not drive for 24 hours after the exam.  You may have a small amount of blood in your stool.  You may pass gas and have mild abdominal cramping or bloating due to the air that was used to inflate your colon during the exam.  It is up to you to get the results of your procedure. Ask your health care provider, or the department performing the procedure, when your results will be ready. This information is not intended to replace advice given to you by your health care provider. Make sure you discuss any questions you have with your health care provider. Document Released: 06/03/2000 Document Revised: 04/06/2016 Document Reviewed: 08/18/2015 Elsevier Interactive Patient Education  2018 Elsevier Inc.   Fatty Liver Fatty liver, also called hepatic steatosis or steatohepatitis, is a condition in which too much fat has built up in your liver cells. The liver removes harmful substances from your bloodstream. It produces fluids your body needs. It also helps your body use and store energy from the food you eat. In many cases, fatty liver does not cause symptoms or problems. It is often diagnosed when tests are being done for other reasons. However, over time, fatty liver can cause inflammation that may lead to more serious liver problems, such as scarring of the liver (cirrhosis). What are the causes? Causes of fatty liver may include:  Drinking too much alcohol.  Poor nutrition.  Obesity.  Cushing  syndrome.  Diabetes.  Hyperlipidemia.  Pregnancy.  Certain drugs.  Poisons.  Some viral infections.  What increases the risk? You may be more likely to develop fatty liver if you:  Abuse alcohol.  Are pregnant.  Are overweight.  Have diabetes.  Have hepatitis.  Have a high triglyceride level.  What are the signs or symptoms? Fatty liver often does not cause any symptoms. In cases where symptoms develop, they can include:  Fatigue.  Weakness.  Weight loss.  Confusion.  Abdominal pain.  Yellowing of your skin and the white parts of your eyes (jaundice).  Nausea and vomiting.  How is this diagnosed? Fatty liver may be diagnosed by:  Physical exam and medical history.  Blood tests.  Imaging tests, such as an ultrasound, CT scan, or MRI.  Liver biopsy. A small sample of liver tissue is removed using a needle. The sample is then looked at under a microscope.  How is this treated? Fatty liver is often caused by other health conditions. Treatment for fatty liver may involve medicines and lifestyle changes to manage conditions such as:  Alcoholism.  High cholesterol.  Diabetes.  Being overweight or obese.  Follow these instructions at home:  Eat a healthy diet as directed by your health care provider.  Exercise regularly. This can help you lose weight and control your cholesterol and diabetes. Talk to your health care provider about an exercise plan and which activities are best for you.  Do not drink alcohol.  Take medicines only as directed by your health care provider. Contact a health care provider if: You have difficulty controlling your:  Blood sugar.  Cholesterol.  Alcohol consumption.  Get help right away if:  You have abdominal pain.  You have jaundice.  You have nausea and vomiting. This information is not intended to replace advice given to you by your health care provider. Make sure you discuss any questions you have with  your health care provider. Document Released: 07/22/2005 Document Revised: 11/12/2015 Document Reviewed: 10/16/2013 Elsevier Interactive Patient Education  2018 [employer].   Tennis Elbow Tennis elbow (lateral epicondylitis) is inflammation of the outer tendons of your forearm close to your elbow. Your tendons attach your muscles to your bones. The outer tendons of your forearm are used to extend your wrist, and they attach on the outside part of your elbow. Tennis elbow is often found in people who play tennis, but anyone may get the condition from repeatedly extending the wrist or turning the forearm. What are the causes? This condition is caused by repeatedly extending your wrist and using your hands. It can result from sports or work that requires repetitive forearm movements. Tennis elbow may also be caused by an injury. What increases the risk? You have a higher risk of developing tennis elbow if you play tennis or another racquet sport. You also have a higher risk if you frequently use your hands for work. This condition is also more likely to develop in:  Musicians.  Carpenters, painters, and plumbers.  Cooks.  Cashiers.  People who work in [employer].  Holiday representative workers.  Butchers.  People who use computers.  What are the signs or symptoms? Symptoms of this condition include:  Pain and tenderness in your forearm and the outer part of your elbow. You may only feel the pain when you use your arm, or you may feel it even when you are not using your arm.  A burning feeling that runs from your elbow through your arm.  Weak grip in your hands.  How is this diagnosed? This condition may be diagnosed by medical history and physical exam. You may also have other tests, including:  X-rays.  MRI.  How is this treated? Your health care provider will recommend lifestyle adjustments, such as resting and icing your arm. Treatment may also include:  Medicines for  inflammation. This may include shots of cortisone if your pain continues.  Physical therapy. This may include massage or exercises.  An elbow brace.  Surgery may eventually be recommended if your pain does not go away with treatment. Follow these instructions at home: Activity  Rest your elbow and wrist as directed by your health care provider. Try to avoid any activities that caused the problem until your health care provider says that you can do them again.  If a physical therapist teaches you exercises, do all of them as directed.  If you lift an object, lift it with your palm facing upward. This lowers the stress on your elbow. Lifestyle  If your tennis elbow is caused by sports, check your equipment and make  sure that: ? You are using it correctly. ? It is the best fit for you.  If your tennis elbow is caused by work, take breaks frequently, if you are able. Talk with your manager about how to best perform tasks in a way that is safe. ? If your tennis elbow is caused by computer use, talk with your manager about any changes that can be made to your work environment. General instructions  If directed, apply ice to the painful area: ? Put ice in a plastic bag. ? Place a towel between your skin and the bag. ? Leave the ice on for 20 minutes, 2-3 times per day.  Take medicines only as directed by your health care provider.  If you were given a brace, wear it as directed by your health care provider.  Keep all follow-up visits as directed by your health care provider. This is important. Contact a health care provider if:  Your pain does not get better with treatment.  Your pain gets worse.  You have numbness or weakness in your forearm, hand, or fingers. This information is not intended to replace advice given to you by your health care provider. Make sure you discuss any questions you have with your health care provider. Document Released: 06/06/2005 Document Revised:  02/04/2016 Document Reviewed: 06/02/2014 Elsevier Interactive Patient Education  [employer]2018 Else[employer] Inc.

## 2017-09-28 NOTE — Progress Notes (Signed)
Pre visit review using our clinic review tool, if applicable. No additional management support is needed unless otherwise documented below in the visit note. 

## 2017-09-29 LAB — HEPATITIS B SURFACE ANTIBODY, QUANTITATIVE: Hepatitis B-Post: <5 m[IU]/mL — ABNORMAL LOW (ref >=10)

## 2017-10-01 ENCOUNTER — Encounter: Payer: Self-pay | Admitting: Internal Medicine

## 2017-10-01 NOTE — Progress Notes (Signed)
Chief Complaint  Patient presents with  . Follow-up    transfer from Dr. Adriana Simasook   Transfer from Dr. Adriana Simasook  1. Pt c/o left elbow pain, lateral new, ROM ok. Both elbows hurt but L>R. This is new x 1 month . No h/o trauma pain 7.5/10. No meds tried 2. C/o right index finger knuckle pain he hyperextended his finger/bent if backwards 3 weeks ago playing basketball with the kids and it was swollen and painful though swelling has reduced and he had dec. ROM. No meds tried  3. HTN uncontrolled today he did not take hyzaar 100/25 today b/c he thought he was not supposed to fasting for labs  4. Fatty liver noticed on [place] 2011 disc with pt today   Review of Systems  Constitutional: Negative for weight loss.  HENT: Negative for hearing loss.   Eyes: Negative for blurred vision.  Respiratory: Negative for shortness of breath.   Cardiovascular: Negative for chest pain.  Gastrointestinal: Negative for abdominal pain.  Musculoskeletal: Positive for joint pain.  Skin: Negative for rash.  Neurological: Negative for headaches.  Psychiatric/Behavioral: Negative for depression.   Past Medical History:  Diagnosis Date  . Allergic rhinitis due to pollen   . HTN (hypertension)   . Hyperlipidemia   . ITP (idiopathic thrombocytopenic purpura)    off prednisone since 02/2010, ?etiology per pt 2/2 antibiotics    Past Surgical History:  Procedure Laterality Date  . NASAL SINUS SURGERY     History reviewed. No pertinent family history. Social History   Socioeconomic History  . Marital status: Married    Spouse name: Not on file  . Number of children: 3  . Years of education: Not on file  . Highest education level: Not on file  Occupational History  . Occupation: Transport plannersales manager, retired as of 2019    Employer: Teacher, [employer]  Social Needs  . Financial resource strain: Not on file  . Food insecurity:    Worry: Not on file    Inability: Not on file  . Transportation needs:    Medical: Not on file      Non-medical: Not on file  Tobacco Use  . Smoking status: Never Smoker  . Smokeless tobacco: Never Used  Substance and Sexual Activity  . Alcohol use: Yes    Comment: occ wine  . Drug use: No  . Sexual activity: Not on file  Lifestyle  . Physical activity:    Days per week: Not on file    Minutes per session: Not on file  . Stress: Not on file  Relationships  . Social connections:    Talks on phone: Not on file    Gets together: Not on file    Attends religious service: Not on file    Active member of club or organization: Not on file    Attends meetings of clubs or organizations: Not on file    Relationship status: Not on file  . Intimate partner violence:    Fear of current or ex partner: Not on file    Emotionally abused: Not on file    Physically abused: Not on file    Forced sexual activity: Not on file  Other Topics Concern  . 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 fluticasone (FLONASE) 50 MCG/ACT nasal spray PLACE 2 SPRAYS INTO BOTH NOSTRILS DAILY.  .Marland Kitchen  losartan-hydrochlorothiazide (HYZAAR) 100-25 MG tablet TAKE 1 TABLET BY MOUTH DAILY.   Allergies  Allergen Reactions  . Hydrocodone-Chlorpheniramine Itching   Recent Results (from the past 2160 hour(s))  Comprehensive metabolic panel     Status: Abnormal   Collection Time: 09/28/17  9:39 AM  Result Value Ref Range   Sodium 139 135 - 145 mEq/L   Potassium 3.8 3.5 - 5.1 mEq/L   Chloride 101 96 - 112 mEq/L   CO2 29 19 - 32 mEq/L   Glucose, Bld 101 (H) 70 - 99 mg/dL   BUN 14 6 - 23 mg/dL   Creatinine, Ser 1.61 0.40 - 1.50 mg/dL   Total Bilirubin 1.2 0.2 - 1.2 mg/dL   Alkaline Phosphatase 51 39 - 117 U/L   AST 29 0 - 37 U/L   ALT 84 (H) 0 - 53 U/L   Total Protein 6.8 6.0 - 8.3 g/dL   Albumin 4.7 3.5 - 5.2 g/dL   Calcium 9.6 8.4 - 09.6 mg/dL   GFR 04.54 >09.81 mL/min  CBC with  Differential/Platelet     Status: None   Collection Time: 09/28/17  9:39 AM  Result Value Ref Range   WBC 5.1 4.0 - 10.5 K/uL   RBC 4.94 4.22 - 5.81 Mil/uL   Hemoglobin 15.7 13.0 - 17.0 g/dL   HCT 19.1 47.8 - 29.5 %   MCV 91.5 78.0 - 100.0 fl   MCHC 34.8 30.0 - 36.0 g/dL   RDW 62.1 30.8 - 65.7 %   Platelets 161.0 150.0 - 400.0 K/uL   Neutrophils Relative % 57.8 43.0 - 77.0 %   Lymphocytes Relative 29.7 12.0 - 46.0 %   Monocytes Relative 8.0 3.0 - 12.0 %   Eosinophils Relative 4.0 0.0 - 5.0 %   Basophils Relative 0.5 0.0 - 3.0 %   Neutro Abs 2.9 1.4 - 7.7 K/uL   Lymphs Abs 1.5 0.7 - 4.0 K/uL   Monocytes Absolute 0.4 0.1 - 1.0 K/uL   Eosinophils Absolute 0.2 0.0 - 0.7 K/uL   Basophils Absolute 0.0 0.0 - 0.1 K/uL  Lipid panel     Status: Abnormal   Collection Time: 09/28/17  9:39 AM  Result Value Ref Range   Cholesterol 217 (H) 0 - 200 mg/dL    Comment: ATP III Classification       Desirable:  < 200 mg/dL               Borderline High:  200 - 239 mg/dL          High:  > = 846 mg/dL   Triglycerides 962.9 0.0 - 149.0 mg/dL    Comment: Normal:  <528 mg/dLBorderline High:  150 - 199 mg/dL   HDL 41.32 >44.01 mg/dL   VLDL 02.7 0.0 - 25.3 mg/dL   LDL Cholesterol 664 (H) 0 - 99 mg/dL   Total CHOL/HDL Ratio 5     Comment:                Men          Women1/2 Average Risk     3.4          3.3Average Risk          5.0          4.42X Average Risk          9.6          7.13X Average Risk          15.0  11.0                       NonHDL 169.71     Comment: NOTE:  Non-HDL goal should be 30 mg/dL higher than patient's LDL goal (i.e. LDL goal of < 70 mg/dL, would have non-HDL goal of < 100 mg/dL)  Urinalysis, Routine w reflex microscopic     Status: Abnormal   Collection Time: 09/28/17  9:39 AM  Result Value Ref Range   Color, Urine YELLOW Yellow;Lt. Yellow   APPearance CLEAR Clear   Specific Gravity, Urine 1.015 1.000 - 1.030   pH 6.0 5.0 - 8.0   Total Protein, Urine NEGATIVE Negative     Urine Glucose NEGATIVE Negative   Ketones, ur NEGATIVE Negative   Bilirubin Urine NEGATIVE Negative   Hgb urine dipstick NEGATIVE Negative   Urobilinogen, UA 0.2 0.0 - 1.0   Leukocytes, UA NEGATIVE Negative   Nitrite NEGATIVE Negative   WBC, UA 0-2/hpf 0-2/hpf   RBC / HPF none seen 0-2/hpf   Mucus, UA Presence of (A) None   Bacteria, UA Rare(<10/hpf) (A) None  TSH     Status: None   Collection Time: 09/28/17  9:39 AM  Result Value Ref Range   TSH 2.60 0.35 - 4.50 uIU/mL  T4, free     Status: None   Collection Time: 09/28/17  9:39 AM  Result Value Ref Range   Free T4 0.83 0.60 - 1.60 ng/dL    Comment: Specimens from patients who are undergoing biotin therapy and /or ingesting biotin supplements may contain high levels of biotin.  The higher biotin concentration in these specimens interferes with this Free T4 assay.  Specimens that contain high levels  of biotin may cause false high results for this Free T4 assay.  Please interpret results in light of the total clinical presentation of the patient.    PSA     Status: None   Collection Time: 09/28/17  9:39 AM  Result Value Ref Range   PSA 0.61 0.10 - 4.00 ng/mL    Comment: Test performed using Access Hybritech PSA Assay, a parmagnetic partical, chemiluminecent immunoassay.  Hepatitis B surface antibody     Status: Abnormal   Collection Time: 09/28/17  9:39 AM  Result Value Ref Range   Hepatitis B-Post <5 (L) > OR = 10 mIU/mL    Comment: . Patient does not have immunity to hepatitis B virus. . For additional information, please refer to http://education.questdiagnostics.com/faq/FAQ105 (This link is being provided for informational/ educational purposes only).    Objective  Body mass index is 33.85 kg/m. Wt Readings from Last 3 Encounters:  09/28/17 242 lb 11.2 oz (110.1 kg)  08/04/16 233 lb 12.8 oz (106.1 kg)  07/22/16 238 lb 3.2 oz (108 kg)   Temp Readings from Last 3 Encounters:  09/28/17 98.3 F (36.8 C) (Oral)   08/04/16 97.6 F (36.4 C) (Oral)  07/22/16 98.7 F (37.1 C) (Oral)   BP Readings from Last 3 Encounters:  09/28/17 (!) 154/96  08/04/16 121/84  07/22/16 (!) 158/95   Pulse Readings from Last 3 Encounters:  09/28/17 77  08/04/16 67  07/22/16 66    Physical Exam  Constitutional: He is oriented to person, place, and time. He appears well-developed and well-nourished.  HENT:  Head: Normocephalic and atraumatic.  Mouth/Throat: Oropharynx is clear and moist and mucous membranes are normal.  Eyes: Pupils are equal, round, and reactive to light. Conjunctivae are normal.  Cardiovascular: Normal rate,  regular rhythm and normal heart sounds.  Pulmonary/Chest: Effort normal and breath sounds normal.  Musculoskeletal:       Right elbow: Tenderness found. Lateral epicondyle tenderness noted.       Left elbow: Tenderness found. Lateral epicondyle tenderness noted.       Arms: Neurological: He is alert and oriented to person, place, and time. Gait normal.  Skin: Skin is warm, dry and intact.  Psychiatric: He has a normal mood and affect. His speech is normal and behavior is normal. Judgment and thought content normal. Cognition and memory are normal.  Nursing note and vitals reviewed.   Assessment   1. HTN  2. Fatty liver per [place] 2011  3. Lateral left epicondylitis > right, right index finger ttp and reduced ROM s/p trauma  4. HM  Plan  1.  Check labs today  rec take hyzaar before next visit if BP not controlled consider add low dose norvasc 2.5 no meds today  2. Check hep B status, pt declines hep C check  3. Xray left elbow and right hand  OTC meds for now  Declines ortho referral for now  4.  Declines flu shot  Given Tdap today  Check hep B status  Declines Hep C check and vitamin d  Labs today CMET, CBC, lipid, UA, TSH, T4, PSA, hep B titer  Will check PSA today and do DRE at f/u  Re-assess colonoscopy at f/u  Consider dermatology in future.   Will bill physical at f/u   Provider: Dr. French Ana McLean-Scocuzza-Internal Medicine

## 2017-10-05 ENCOUNTER — Telehealth: Payer: Self-pay | Admitting: Internal Medicine

## 2017-10-05 NOTE — Telephone Encounter (Signed)
Copied from CRM [phone]. Topic: Quick Communication - Lab Results >> Oct 04, 2017  3:25 PM Bronwen BettersBooth, Brock T, CMA wrote: Called patient to inform them of [id number] lab results. When patient returns call, triage nurse may disclose results. >> Oct 05, 2017 10:11 AM Rudi CocoLathan, Bayli Quesinberry M, NT wrote: Pt. Calling to receive lab results cb# [phone]

## 2017-10-05 NOTE — Telephone Encounter (Signed)
Left message to call back to review lab results 

## 2017-10-05 NOTE — Telephone Encounter (Signed)
[facility]  [facility]  Angela NevinQue  And it was  Documented  And  Routed  [place]  [place]

## 2017-10-11 ENCOUNTER — Ambulatory Visit (INDEPENDENT_AMBULATORY_CARE_PROVIDER_SITE_OTHER): Payer: BLUE CROSS/BLUE SHIELD | Admitting: Internal Medicine

## 2017-10-11 ENCOUNTER — Encounter: Payer: Self-pay | Admitting: Internal Medicine

## 2017-10-11 VITALS — BP 140/86 | HR 68 | Temp 98.1°F | Ht 71.0 in | Wt 237.2 lb

## 2017-10-11 DIAGNOSIS — B354 Tinea corporis: Secondary | ICD-10-CM

## 2017-10-11 DIAGNOSIS — M79644 Pain in right finger(s): Secondary | ICD-10-CM | POA: Insufficient documentation

## 2017-10-11 DIAGNOSIS — E669 Obesity, unspecified: Secondary | ICD-10-CM

## 2017-10-11 DIAGNOSIS — Z0000 Encounter for general adult medical examination without abnormal findings: Secondary | ICD-10-CM | POA: Diagnosis not present

## 2017-10-11 DIAGNOSIS — Z6831 Body mass index (BMI) 31.0-31.9, adult: Secondary | ICD-10-CM | POA: Insufficient documentation

## 2017-10-11 DIAGNOSIS — I10 Essential (primary) hypertension: Secondary | ICD-10-CM | POA: Diagnosis not present

## 2017-10-11 DIAGNOSIS — R7989 Other specified abnormal findings of blood chemistry: Secondary | ICD-10-CM | POA: Insufficient documentation

## 2017-10-11 DIAGNOSIS — R945 Abnormal results of liver function studies: Secondary | ICD-10-CM

## 2017-10-11 DIAGNOSIS — K921 Melena: Secondary | ICD-10-CM

## 2017-10-11 DIAGNOSIS — K760 Fatty (change of) liver, not elsewhere classified: Secondary | ICD-10-CM | POA: Diagnosis not present

## 2017-10-11 LAB — HEMOCCULT GUIAC POC 1CARD (OFFICE): FECAL OCCULT BLD: NEGATIVE

## 2017-10-11 MED ORDER — CLOTRIMAZOLE 1 % EX CREA: 1.0000 "application " | TOPICAL_CREAM | Freq: Two times a day (BID) | CUTANEOUS | 0 refills | Status: DC

## 2017-10-11 NOTE — Progress Notes (Signed)
Chief Complaint  Patient presents with  . Follow-up   Annual exam  1. Reviewed labs elevated lfts with h/o fatty liver, HLD 2. Requests referral for hand pain 1st index finger with reduced ROM, pain with ROM >1 month s/p trauma called [facility] today for pt and pt has appt 10/12/17 11:15 am with Dr. [place]ie [place]  3. C/o red round skin lesion to left thigh it was on right lower leg used clotrimazole and improved Rx expired and needs refill  4. HTN improved today on hyzaar 100-25 mg qd   Review of Systems  Constitutional: Positive for weight loss.       Down 7 lbs trying   HENT: Negative for hearing loss.   Eyes: Negative for blurred vision.  Respiratory: Negative for shortness of breath.   Cardiovascular: Negative for chest pain.  Gastrointestinal: Negative for abdominal pain.  Musculoskeletal: Positive for joint pain.  Neurological: Negative for headaches.  Psychiatric/Behavioral: Negative for depression.   Past Medical History:  Diagnosis Date  . Allergic rhinitis due to pollen   . HTN (hypertension)   . Hyperlipidemia   . ITP (idiopathic thrombocytopenic purpura)    off prednisone since 02/2010, ?etiology per pt 2/2 antibiotics    Past Surgical History:  Procedure Laterality Date  . NASAL SINUS SURGERY     Family History  Problem Relation Age of Onset  . Arthritis Other    Social History   Socioeconomic History  . Marital status: Married    Spouse name: Not on file  . Number of children: 3  . Years of education: Not on file  . Highest education level: Not on file  Occupational History  . Occupation: Transport plannersales manager, retired as of 2019    Employer: Teacher, [employer]  Social Needs  . Financial resource strain: Not on file  . Food insecurity:    Worry: Not on file    Inability: Not on file  . Transportation needs:    Medical: Not on file    Non-medical: Not on file  Tobacco Use  . Smoking status: Never Smoker  . Smokeless tobacco: Never Used    Substance and Sexual Activity  . Alcohol use: Yes    Comment: occ wine  . Drug use: No  . Sexual activity: Not on file  Lifestyle  . Physical activity:    Days per week: Not on file    Minutes per session: Not on file  . Stress: Not on file  Relationships  . Social connections:    Talks on phone: Not on file    Gets together: Not on file    Attends religious service: Not on file    Active member of club or organization: Not on file    Attends meetings of clubs or organizations: Not on file    Relationship status: Not on file  . Intimate partner violence:    Fear of current or ex partner: Not on file    Emotionally abused: Not on file    Physically abused: Not on file    Forced sexual activity: Not on file  Other Topics Concern  . 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 fluticasone (FLONASE) 50 MCG/ACT nasal spray PLACE 2 SPRAYS INTO BOTH NOSTRILS DAILY.  Marland Kitchen. losartan-hydrochlorothiazide (HYZAAR) 100-25 MG tablet TAKE 1 TABLET BY MOUTH DAILY.   Allergies  Allergen Reactions  . Hydrocodone-Chlorpheniramine Itching   Recent Results (from the past 2160 hour(s))  Comprehensive metabolic panel     Status: Abnormal   Collection Time: 09/28/17  9:39 AM  Result Value Ref Range   Sodium 139 135 - 145 mEq/L   Potassium 3.8 3.5 - 5.1 mEq/L   Chloride 101 96 - 112 mEq/L   CO2 29 19 - 32 mEq/L   Glucose, Bld 101 (H) 70 - 99 mg/dL   BUN 14 6 - 23 mg/dL   Creatinine, Ser 4.09 0.40 - 1.50 mg/dL   Total Bilirubin 1.2 0.2 - 1.2 mg/dL   Alkaline Phosphatase 51 39 - 117 U/L   AST 29 0 - 37 U/L   ALT 84 (H) 0 - 53 U/L   Total Protein 6.8 6.0 - 8.3 g/dL   Albumin 4.7 3.5 - 5.2 g/dL   Calcium 9.6 8.4 - 81.1 mg/dL   GFR 91.47 >82.95 mL/min  CBC with Differential/Platelet     Status: None   Collection Time: 09/28/17  9:39 AM  Result Value Ref Range   WBC 5.1 4.0 - 10.5 K/uL    RBC 4.94 4.22 - 5.81 Mil/uL   Hemoglobin 15.7 13.0 - 17.0 g/dL   HCT 62.1 30.8 - 65.7 %   MCV 91.5 78.0 - 100.0 fl   MCHC 34.8 30.0 - 36.0 g/dL   RDW 84.6 96.2 - 95.2 %   Platelets 161.0 150.0 - 400.0 K/uL   Neutrophils Relative % 57.8 43.0 - 77.0 %   Lymphocytes Relative 29.7 12.0 - 46.0 %   Monocytes Relative 8.0 3.0 - 12.0 %   Eosinophils Relative 4.0 0.0 - 5.0 %   Basophils Relative 0.5 0.0 - 3.0 %   Neutro Abs 2.9 1.4 - 7.7 K/uL   Lymphs Abs 1.5 0.7 - 4.0 K/uL   Monocytes Absolute 0.4 0.1 - 1.0 K/uL   Eosinophils Absolute 0.2 0.0 - 0.7 K/uL   Basophils Absolute 0.0 0.0 - 0.1 K/uL  Lipid panel     Status: Abnormal   Collection Time: 09/28/17  9:39 AM  Result Value Ref Range   Cholesterol 217 (H) 0 - 200 mg/dL    Comment: ATP III Classification       Desirable:  < 200 mg/dL               Borderline High:  200 - 239 mg/dL          High:  > = 841 mg/dL   Triglycerides 324.4 0.0 - 149.0 mg/dL    Comment: Normal:  <010 mg/dLBorderline High:  150 - 199 mg/dL   HDL 27.25 >36.64 mg/dL   VLDL 40.3 0.0 - 47.4 mg/dL   LDL Cholesterol 259 (H) 0 - 99 mg/dL   Total CHOL/HDL Ratio 5     Comment:                Men          Women1/2 Average Risk     3.4          3.3Average Risk          5.0          4.42X Average Risk          9.6          7.13X Average Risk          15.0          11.0  NonHDL 169.71     Comment: NOTE:  Non-HDL goal should be 30 mg/dL higher than patient's LDL goal (i.e. LDL goal of < 70 mg/dL, would have non-HDL goal of < 100 mg/dL)  Urinalysis, Routine w reflex microscopic     Status: Abnormal   Collection Time: 09/28/17  9:39 AM  Result Value Ref Range   Color, Urine YELLOW Yellow;Lt. Yellow   APPearance CLEAR Clear   Specific Gravity, Urine 1.015 1.000 - 1.030   pH 6.0 5.0 - 8.0   Total Protein, Urine NEGATIVE Negative   Urine Glucose NEGATIVE Negative   Ketones, ur NEGATIVE Negative   Bilirubin Urine NEGATIVE Negative   Hgb urine dipstick  NEGATIVE Negative   Urobilinogen, UA 0.2 0.0 - 1.0   Leukocytes, UA NEGATIVE Negative   Nitrite NEGATIVE Negative   WBC, UA 0-2/hpf 0-2/hpf   RBC / HPF none seen 0-2/hpf   Mucus, UA Presence of (A) None   Bacteria, UA Rare(<10/hpf) (A) None  TSH     Status: None   Collection Time: 09/28/17  9:39 AM  Result Value Ref Range   TSH 2.60 0.35 - 4.50 uIU/mL  T4, free     Status: None   Collection Time: 09/28/17  9:39 AM  Result Value Ref Range   Free T4 0.83 0.60 - 1.60 ng/dL    Comment: Specimens from patients who are undergoing biotin therapy and /or ingesting biotin supplements may contain high levels of biotin.  The higher biotin concentration in these specimens interferes with this Free T4 assay.  Specimens that contain high levels  of biotin may cause false high results for this Free T4 assay.  Please interpret results in light of the total clinical presentation of the patient.    PSA     Status: None   Collection Time: 09/28/17  9:39 AM  Result Value Ref Range   PSA 0.61 0.10 - 4.00 ng/mL    Comment: Test performed using Access Hybritech PSA Assay, a parmagnetic partical, chemiluminecent immunoassay.  Hepatitis B surface antibody     Status: Abnormal   Collection Time: 09/28/17  9:39 AM  Result Value Ref Range   Hepatitis B-Post <5 (L) > OR = 10 mIU/mL    Comment: . Patient does not have immunity to hepatitis B virus. . For additional information, please refer to http://education.quest[facility].com/faq/FAQ105 (This link is being provided for informational/ educational purposes only).    Objective  Body mass index is 33.08 kg/m. Wt Readings from Last 3 Encounters:  10/11/17 237 lb 3.2 oz (107.6 kg)  09/28/17 242 lb 11.2 oz (110.1 kg)  08/04/16 233 lb 12.8 oz (106.1 kg)   Temp Readings from Last 3 Encounters:  10/11/17 98.1 F (36.7 C) (Oral)  09/28/17 98.3 F (36.8 C) (Oral)  08/04/16 97.6 F (36.4 C) (Oral)   BP Readings from Last 3 Encounters:  10/11/17  140/86  09/28/17 (!) 154/96  08/04/16 121/84   Pulse Readings from Last 3 Encounters:  10/11/17 68  09/28/17 77  08/04/16 67    Physical Exam  Constitutional: He is oriented to person, place, and time. Vital signs are normal. He appears well-developed and well-nourished. He is cooperative.  HENT:  Head: Normocephalic and atraumatic.  Mouth/Throat: Oropharynx is clear and moist and mucous membranes are normal.  Eyes: Pupils are equal, round, and reactive to light. Conjunctivae are normal.  Cardiovascular: Normal rate, regular rhythm and normal heart sounds.  Pulmonary/Chest: Effort normal and breath sounds normal.  Abdominal: Soft. Bowel  sounds are normal.  Genitourinary: Prostate normal. Rectal exam shows guaiac negative stool. Prostate is not enlarged and not tender.  Genitourinary Comments: FOBT negative   Musculoskeletal: He exhibits tenderness.  Neurological: He is alert and oriented to person, place, and time.  Skin: Skin is warm and dry.     Tanned skin  +tinea left thigh   Psychiatric: He has a normal mood and affect. His speech is normal and behavior is normal. Judgment and thought content normal. Cognition and memory are normal.  Nursing note and vitals reviewed.   Assessment   1. HTN  2. Abnormal lfts likely 2/2 fatty liver  3. HLD  4. Right index finger pain and ROM pain s/p trauma >1 month ago  -T corporis left thigh and right lower leg 5. Tinea pedis  6. HM/annual exam   Plan   1. Cont hyzaar check to see if on recall list with pharmacy  2.  [place] 2014 + fatty liver  Consider repeat and/or f/u GI in future  3. Given cholesterol info  Check lipid in 6 months  Pt trying lifestyle changes wants to hold on cholesterol med for now  4. Refer to [facility] Dr. Stephenie Acres [phone] appt 10/12/17 11:15 am  5. Refilled clotrimazole  6.  Declines flu shot  UTD Tdap  rec hep A/B vaccine pt will think about given info today Declines Hep C check and vitamin  d  DRE today normal PSA normal  Re-assess colonoscopy at f/u pt wants to hold  FOBT neg today  Consider dermatology in future.    Provider: Dr. French Ana McLean-Scocuzza-Internal Medicine

## 2017-10-11 NOTE — Patient Instructions (Addendum)
Please follow up in 6 months  let me know if you want referral for colonoscopy  Check with pharmacy about BP medication to make sure not on recall  [facility] appt 10/12/17 at 11:15 with Dr. Stephenie Acres   Clotrimazole cream 2x per day on your leg    Colonoscopy, Adult A colonoscopy is an exam to look at the entire large intestine. During the exam, a lubricated, bendable tube is inserted into the anus and then passed into the rectum, colon, and other parts of the large intestine. A colonoscopy is often done as a part of normal colorectal screening or in response to certain symptoms, such as anemia, persistent diarrhea, abdominal pain, and blood in the stool. The exam can help screen for and diagnose medical problems, including:  Tumors.  Polyps.  Inflammation.  Areas of bleeding.  Tell a health care provider about:  Any allergies you have.  All medicines you are taking, including vitamins, herbs, eye drops, creams, and over-the-counter medicines.  Any problems you or family members have had with anesthetic medicines.  Any blood disorders you have.  Any surgeries you have had.  Any medical conditions you have.  Any problems you have had passing stool. What are the risks? Generally, this is a safe procedure. However, problems may occur, including:  Bleeding.  A tear in the intestine.  A reaction to medicines given during the exam.  Infection (rare).  What happens before the procedure? Eating and drinking restrictions Follow instructions from your health care provider about eating and drinking, which may include:  A few days before the procedure - follow a low-fiber diet. Avoid nuts, seeds, dried fruit, raw fruits, and vegetables.  1-3 days before the procedure - follow a clear liquid diet. Drink only clear liquids, such as clear broth or bouillon, black coffee or tea, clear juice, clear soft drinks or sports drinks, gelatin dessert, and popsicles. Avoid any liquids that  contain red or purple dye.  On the day of the procedure - do not eat or drink anything during the 2 hours before the procedure, or within the time period that your health care provider recommends.  Bowel prep If you were prescribed an oral bowel prep to clean out your colon:  Take it as told by your health care provider. Starting the day before your procedure, you will need to drink a large amount of medicated liquid. The liquid will cause you to have multiple loose stools until your stool is almost clear or light green.  If your skin or anus gets irritated from diarrhea, you may use these to relieve the irritation: ? Medicated wipes, such as adult wet wipes with aloe and vitamin E. ? A skin soothing-product like petroleum jelly.  If you vomit while drinking the bowel prep, take a break for up to 60 minutes and then begin the bowel prep again. If vomiting continues and you cannot take the bowel prep without vomiting, call your health care provider.  General instructions  Ask your health care provider about changing or stopping your regular medicines. This is especially important if you are taking diabetes medicines or blood thinners.  Plan to have someone take you home from the hospital or clinic. What happens during the procedure?  An IV tube may be inserted into one of your veins.  You will be given medicine to help you relax (sedative).  To reduce your risk of infection: ? Your health care team will wash or sanitize their hands. ? Your anal  area will be washed with soap.  You will be asked to lie on your side with your knees bent.  Your health care provider will lubricate a long, thin, flexible tube. The tube will have a camera and a light on the end.  The tube will be inserted into your anus.  The tube will be gently eased through your rectum and colon.  Air will be delivered into your colon to keep it open. You may feel some pressure or cramping.  The camera will be used  to take images during the procedure.  A small tissue sample may be removed from your body to be examined under a microscope (biopsy). If any potential problems are found, the tissue will be sent to a lab for testing.  If small polyps are found, your health care provider may remove them and have them checked for cancer cells.  The tube that was inserted into your anus will be slowly removed. The procedure may vary among health care providers and hospitals. What happens after the procedure?  Your blood pressure, heart rate, breathing rate, and blood oxygen level will be monitored until the medicines you were given have worn off.  Do not drive for 24 hours after the exam.  You may have a small amount of blood in your stool.  You may pass gas and have mild abdominal cramping or bloating due to the air that was used to inflate your colon during the exam.  It is up to you to get the results of your procedure. Ask your health care provider, or the department performing the procedure, when your results will be ready. This information is not intended to replace advice given to you by your health care provider. Make sure you discuss any questions you have with your health care provider. Document Released: 06/03/2000 Document Revised: 04/06/2016 Document Reviewed: 08/18/2015 Elsevier Interactive Patient Education  2018 [employer]   Body Ringworm Body ringworm is an infection of the skin that often causes a ring-shaped rash. Body ringworm can affect any part of your skin. It can spread easily to others. Body ringworm is also called tinea corporis. What are the causes? This condition is caused by funguses called dermatophytes. The condition develops when these funguses grow out of control on the skin. You can get this condition if you touch a person or animal that has it. You can also get it if you share clothing, bedding, towels, or any other object with an infected person or pet. What increases  the risk? This condition is more likely to develop in:  Athletes who often make skin-to-skin contact with other athletes, such as wrestlers.  People who share equipment and mats.  People with a weakened immune system.  What are the signs or symptoms? Symptoms of this condition include:  Itchy, raised red spots and bumps.  Red scaly patches.  A ring-shaped rash. The rash may have: ? A clear center. ? Scales or red bumps at its center. ? Redness near its borders. ? Dry and scaly skin on or around it.  How is this diagnosed? This condition can usually be diagnosed with a skin exam. A skin scraping may be taken from the affected area and examined under a microscope to see if the fungus is present. How is this treated? This condition may be treated with:  An antifungal cream or ointment.  An antifungal shampoo.  Antifungal medicines. These may be prescribed if your ringworm is severe, keeps coming back, or lasts a  long time.  Follow these instructions at home:  Take over-the-counter and prescription medicines only as told by your health care provider.  If you were given an antifungal cream or ointment: ? Use it as told by your health care provider. ? Wash the infected area and dry it completely before applying the cream or ointment.  If you were given an antifungal shampoo: ? Use it as told by your health care provider. ? Leave the shampoo on your body for 3-5 minutes before rinsing.  While you have a rash: ? Wear loose clothing to stop clothes from rubbing and irritating it. ? Wash or change your bed sheets every night.  If your pet has the same infection, take your pet to see a International aid/development worker. How is this prevented?  Practice good hygiene.  Wear sandals or shoes in public places and showers.  Do not share personal items with others.  Avoid touching red patches of skin on other people.  Avoid touching pets that have bald spots.  If you touch an animal that has  a bald spot, wash your hands. Contact a health care provider if:  Your rash continues to spread after 7 days of treatment.  Your rash is not gone in 4 weeks.  The area around your rash gets red, warm, tender, and swollen. This information is not intended to replace advice given to you by your health care provider. Make sure you discuss any questions you have with your health care provider. Document Released: 06/03/2000 Document Revised: 11/12/2015 Document Reviewed: 04/02/2015 Elsevier Interactive Patient Education  2018 [employer].  Cholesterol Cholesterol is a white, waxy, fat-like substance that is needed by the human body in small amounts. The liver makes all the cholesterol we need. Cholesterol is carried from the liver by the blood through the blood vessels. Deposits of cholesterol (plaques) may build up on blood vessel (artery) walls. Plaques make the arteries narrower and stiffer. Cholesterol plaques increase the risk for heart attack and stroke. You cannot feel your cholesterol level even if it is very high. The only way to know that it is high is to have a blood test. Once you know your cholesterol levels, you should keep a record of the test results. Work with your health care provider to keep your levels in the desired range. What do the results mean?  Total cholesterol is a rough measure of all the cholesterol in your blood.  LDL (low-density lipoprotein) is the "bad" cholesterol. This is the type that causes plaque to build up on the artery walls. You want this level to be low.  HDL (high-density lipoprotein) is the "good" cholesterol because it cleans the arteries and carries the LDL away. You want this level to be high.  Triglycerides are fat that the body can either burn for energy or store. High levels are closely linked to heart disease. What are the desired levels of cholesterol?  Total cholesterol below 200.  LDL below 100 for people who are at risk, below 70 for  people at very high risk.  HDL above 40 is good. A level of 60 or higher is considered to be protective against heart disease.  Triglycerides below 150. How can I lower my cholesterol? Diet Follow your diet program as told by your health care provider.  Choose fish or white meat chicken and [place], roasted or baked. Limit fatty cuts of red meat, fried foods, and processed meats, such as sausage and lunch meats.  Eat lots of fresh fruits  and vegetables.  Choose whole grains, beans, pasta, potatoes, and cereals.  Choose olive oil, corn oil, or canola oil, and use only small amounts.  Avoid butter, mayonnaise, shortening, or palm kernel oils.  Avoid foods with trans fats.  Drink skim or nonfat milk and eat low-fat or nonfat yogurt and cheeses. Avoid whole milk, cream, ice cream, egg yolks, and full-fat cheeses.  Healthier desserts include angel food cake, ginger snaps, animal crackers, hard candy, popsicles, and low-fat or nonfat frozen yogurt. Avoid pastries, cakes, pies, and cookies.  Exercise  Follow your exercise program as told by your health care provider. A regular program: ? Helps to decrease LDL and raise HDL. ? Helps with weight control.  Do things that increase your activity level, such as gardening, walking, and taking the stairs.  Ask your health care provider about ways that you can be more active in your daily life.  Medicine  Take over-the-counter and prescription medicines only as told by your health care provider. ? Medicine may be prescribed by your health care provider to help lower cholesterol and decrease the risk for heart disease. This is usually done if diet and exercise have failed to bring down cholesterol levels. ? If you have several risk factors, you may need medicine even if your levels are normal.  This information is not intended to replace advice given to you by your health care provider. Make sure you discuss any questions you have with your  health care provider. Document Released: 03/01/2001 Document Revised: 01/02/2016 Document Reviewed: 12/05/2015 Elsevier Interactive Patient Education  2018 [employer].  Hepatitis A; Hepatitis B Vaccine injection What is this medicine? HEPATITIS A VACCINE; HEPATITIS B VACCINE (hep uh TAHY tis A vak SEEN; hep uh TAHY tis B vak SEEN) is a vaccine to protect from an infection with the hepatitis A and B virus. This vaccine does not contain the live viruses. It will not cause a hepatitis infection. This medicine may be used for other purposes; ask your health care provider or pharmacist if you have questions. COMMON BRAND NAME(S): Twinrix What should I tell my health care provider before I take this medicine? They need to know if you have any of these conditions: -bleeding disorder -fever or infection -heart disease -immune system problems -an unusual or allergic reaction to hepatitis A or B vaccine, neomycin, yeast, thimerosal, other medicines, foods, dyes, or preservatives -pregnant or trying to get pregnant -breast-feeding How should I use this medicine? This vaccine is for injection into a muscle. It is given by a health care professional. A copy of Vaccine Information Statements will be given before each vaccination. Read this sheet carefully each time. The sheet may change frequently. Talk to your pediatrician regarding the use of this medicine in children. Special care may be needed. Overdosage: If you think you have taken too much of this medicine contact a poison control center or emergency room at once. NOTE: This medicine is only for you. Do not share this medicine with others. What if I miss a dose? It is important not to miss your dose. Call your doctor or health care professional if you are unable to keep an appointment. What may interact with this medicine? -medicines that suppress your immune function like adalimumab, anakinra, infliximab -medicines to treat cancer -steroid  medicines like prednisone or cortisone This list may not describe all possible interactions. Give your health care provider a list of all the medicines, herbs, non-prescription drugs, or dietary supplements you use. Also tell  them if you smoke, drink alcohol, or use illegal drugs. Some items may interact with your medicine. What should I watch for while using this medicine? See your health care provider for all shots of this vaccine as directed. You must have 3 to 4 shots of this vaccine for protection from hepatitis A and B infection. Tell your doctor right away if you have any serious or unusual side effects after getting this vaccine. What side effects may I notice from receiving this medicine? Side effects that you should report to your doctor or health care professional as soon as possible: -allergic reactions like skin rash, itching or hives, swelling of the face, lips, or tongue -breathing problems -confused, irritated -fast, irregular heartbeat -flu-like syndrome -numb, tingling pain -seizures Side effects that usually do not require medical attention (report to your doctor or health care professional if they continue or are bothersome): -diarrhea -fever -headache -loss of appetite -muscle pain -nausea -pain, redness, swelling, or irritation at site where injected -tiredness This list may not describe all possible side effects. Call your doctor for medical advice about side effects. You may report side effects to FDA at 1-800-FDA-1088. Where should I keep my medicine? This drug is given in a hospital or clinic and will not be stored at home. NOTE: This sheet is a summary. It may not cover all possible information. If you have questions about this medicine, talk to your doctor, pharmacist, or health care provider.  2018 Elsevier/Gold Standard (2007-10-19 15:21:37) Fatty Liver Fatty liver, also called hepatic steatosis or steatohepatitis, is a condition in which too much fat has  built up in your liver cells. The liver removes harmful substances from your bloodstream. It produces fluids your body needs. It also helps your body use and store energy from the food you eat. In many cases, fatty liver does not cause symptoms or problems. It is often diagnosed when tests are being done for other reasons. However, over time, fatty liver can cause inflammation that may lead to more serious liver problems, such as scarring of the liver (cirrhosis). What are the causes? Causes of fatty liver may include:  Drinking too much alcohol.  Poor nutrition.  Obesity.  Cushing syndrome.  Diabetes.  Hyperlipidemia.  Pregnancy.  Certain drugs.  Poisons.  Some viral infections.  What increases the risk? You may be more likely to develop fatty liver if you:  Abuse alcohol.  Are pregnant.  Are overweight.  Have diabetes.  Have hepatitis.  Have a high triglyceride level.  What are the signs or symptoms? Fatty liver often does not cause any symptoms. In cases where symptoms develop, they can include:  Fatigue.  Weakness.  Weight loss.  Confusion.  Abdominal pain.  Yellowing of your skin and the white parts of your eyes (jaundice).  Nausea and vomiting.  How is this diagnosed? Fatty liver may be diagnosed by:  Physical exam and medical history.  Blood tests.  Imaging tests, such as an ultrasound, CT scan, or MRI.  Liver biopsy. A small sample of liver tissue is removed using a needle. The sample is then looked at under a microscope.  How is this treated? Fatty liver is often caused by other health conditions. Treatment for fatty liver may involve medicines and lifestyle changes to manage conditions such as:  Alcoholism.  High cholesterol.  Diabetes.  Being overweight or obese.  Follow these instructions at home:  Eat a healthy diet as directed by your health care provider.  Exercise regularly. This  can help you lose weight and control  your cholesterol and diabetes. Talk to your health care provider about an exercise plan and which activities are best for you.  Do not drink alcohol.  Take medicines only as directed by your health care provider. Contact a health care provider if: You have difficulty controlling your:  Blood sugar.  Cholesterol.  Alcohol consumption.  Get help right away if:  You have abdominal pain.  You have jaundice.  You have nausea and vomiting. This information is not intended to replace advice given to you by your health care provider. Make sure you discuss any questions you have with your health care provider. Document Released: 07/22/2005 Document Revised: 11/12/2015 Document Reviewed: 10/16/2013 Elsevier Interactive Patient Education  [employer].

## 2017-10-11 NOTE — Progress Notes (Signed)
Pre visit review using our clinic review tool, if applicable. No additional management support is needed unless otherwise documented below in the visit note. 

## 2017-10-12 DIAGNOSIS — M65321 Trigger finger, right index finger: Secondary | ICD-10-CM | POA: Diagnosis not present

## 2017-12-05 ENCOUNTER — Telehealth: Payer: Self-pay

## 2017-12-05 NOTE — Telephone Encounter (Signed)
Copied from CRM #[id number]. Topic: Bill or Statement - Patient/Guarantor Inquiry >> Dec 05, 2017 12:44 PM Lambe, Annette S wrote: Pt is calling about a coding error, had physical but was charged for Office visit.   This was a CPE and should be paid for.  He said he did not get any medication refills or anything    Was told by [facility] billing to call office 

## 2017-12-05 NOTE — Telephone Encounter (Signed)
I spoke with the paient to inform him that he was seen by Dr. Shirlee LatchMclean on 4.11.19 as a transfer of care and see saw him for acute issues and he had labs drawn . She asked the patient to return to have his physical done at a later visit which was done on 4.24.19. He called it robbery and said he would handle it because we would not do anything.

## 2017-12-05 NOTE — Telephone Encounter (Signed)
Copied from CRM [id number]. Topic: Bill or Statement - Patient/Guarantor Inquiry >> Dec 05, 2017 12:44 PM Cipriano BunkerLambe, Annette S wrote: Pt is calling about a coding error, had physical but was charged for Office visit.   This was a CPE and should be paid for.  He said he did not get any medication refills or anything    [facility]

## 2017-12-06 ENCOUNTER — Telehealth: Payer: Self-pay | Admitting: Internal Medicine

## 2017-12-06 NOTE — Telephone Encounter (Signed)
Copied from CRM [phone]. Topic: Quick Communication - Rx Refill/Question >> Dec 06, 2017 12:43 PM Tamela OddiMartin, Don'Quashia, NT wrote: Medication: losartan-hydrochlorothiazide (HYZAAR) 100-25 MG tablet   Has the patient contacted their pharmacy? Yes, called but he was out of refills. Had to call office  (Agent: If no, request that the patient contact the pharmacy for the refill.) (Agent: If yes, when and what did the pharmacy advise?)  Preferred Pharmacy (with phone number or street name): [id number] - [address] [phone] (Phone) [phone] (Fax)      Agent: Please be advised that RX refills may take up to 3 business days. We ask that you follow-up with your pharmacy.

## 2017-12-07 ENCOUNTER — Other Ambulatory Visit: Payer: Self-pay | Admitting: *Deleted

## 2017-12-07 MED ORDER — LOSARTAN POTASSIUM-HCTZ 100-25 MG PO TABS: 1.0000 | ORAL_TABLET | Freq: Every day | ORAL | 5 refills | Status: DC

## 2017-12-07 NOTE — Telephone Encounter (Signed)
Rx refilled per protocol. LOV: 10/11/17 

## 2017-12-28 DIAGNOSIS — H524 Presbyopia: Secondary | ICD-10-CM | POA: Diagnosis not present

## 2018-04-12 ENCOUNTER — Ambulatory Visit: Payer: BLUE CROSS/BLUE SHIELD | Admitting: Internal Medicine

## 2018-05-23 ENCOUNTER — Telehealth: Payer: Self-pay | Admitting: Internal Medicine

## 2018-05-23 ENCOUNTER — Other Ambulatory Visit: Payer: Self-pay | Admitting: Internal Medicine

## 2018-05-23 DIAGNOSIS — I10 Essential (primary) hypertension: Secondary | ICD-10-CM

## 2018-05-23 MED ORDER — LOSARTAN POTASSIUM 100 MG PO TABS: 100.0000 mg | ORAL_TABLET | Freq: Every day | ORAL | 3 refills | Status: DC

## 2018-05-23 MED ORDER — HYDROCHLOROTHIAZIDE 25 MG PO TABS
25.0000 mg | ORAL_TABLET | Freq: Every day | ORAL | 1 refills | Status: DC
Start: 2018-05-23 — End: 2019-05-09

## 2018-05-23 NOTE — Telephone Encounter (Signed)
Losartan-HCTZ is on backorder and the pharmacist suggest 2 Rx. Patient is asking what Dr. Shirlee LatchMcLean would like to do.

## 2018-05-23 NOTE — Telephone Encounter (Signed)
Copied from CRM [phone]. Topic: Quick Communication - See Telephone Encounter >> May 23, 2018 11:17 AM Lorrine KinMcGee, Lorena Clearman B, NT wrote: CRM for notification. See Telephone encounter for: 05/23/18. Patient calling and states that his pharmacy states that losartan-hydrochlorothiazide (HYZAAR) 100-25 MG tablet is on back order. Pharmacist recommended separating the medications, but patient would like to know what Dr Shirlee LatchMcLean would like to do? Please advise.  [id number] , [place] [phone]

## 2018-05-24 DIAGNOSIS — J3489 Other specified disorders of nose and nasal sinuses: Secondary | ICD-10-CM | POA: Diagnosis not present

## 2018-05-24 DIAGNOSIS — J208 Acute bronchitis due to other specified organisms: Secondary | ICD-10-CM | POA: Diagnosis not present

## 2018-05-24 DIAGNOSIS — J029 Acute pharyngitis, unspecified: Secondary | ICD-10-CM | POA: Diagnosis not present

## 2018-05-24 DIAGNOSIS — B354 Tinea corporis: Secondary | ICD-10-CM | POA: Diagnosis not present

## 2018-07-09 DIAGNOSIS — J019 Acute sinusitis, unspecified: Secondary | ICD-10-CM | POA: Diagnosis not present

## 2019-04-28 IMAGING — DX DG HAND COMPLETE 3+V*R*
3 series · 3 of 3 positions shown · non-contrast
Comparison: None

CLINICAL DATA: Hyperextension injury to RIGHT hand while playing
basketball 3 weeks ago, RIGHT index finger injury

EXAM:
RIGHT HAND - COMPLETE 3+ VIEW

[hand pa]
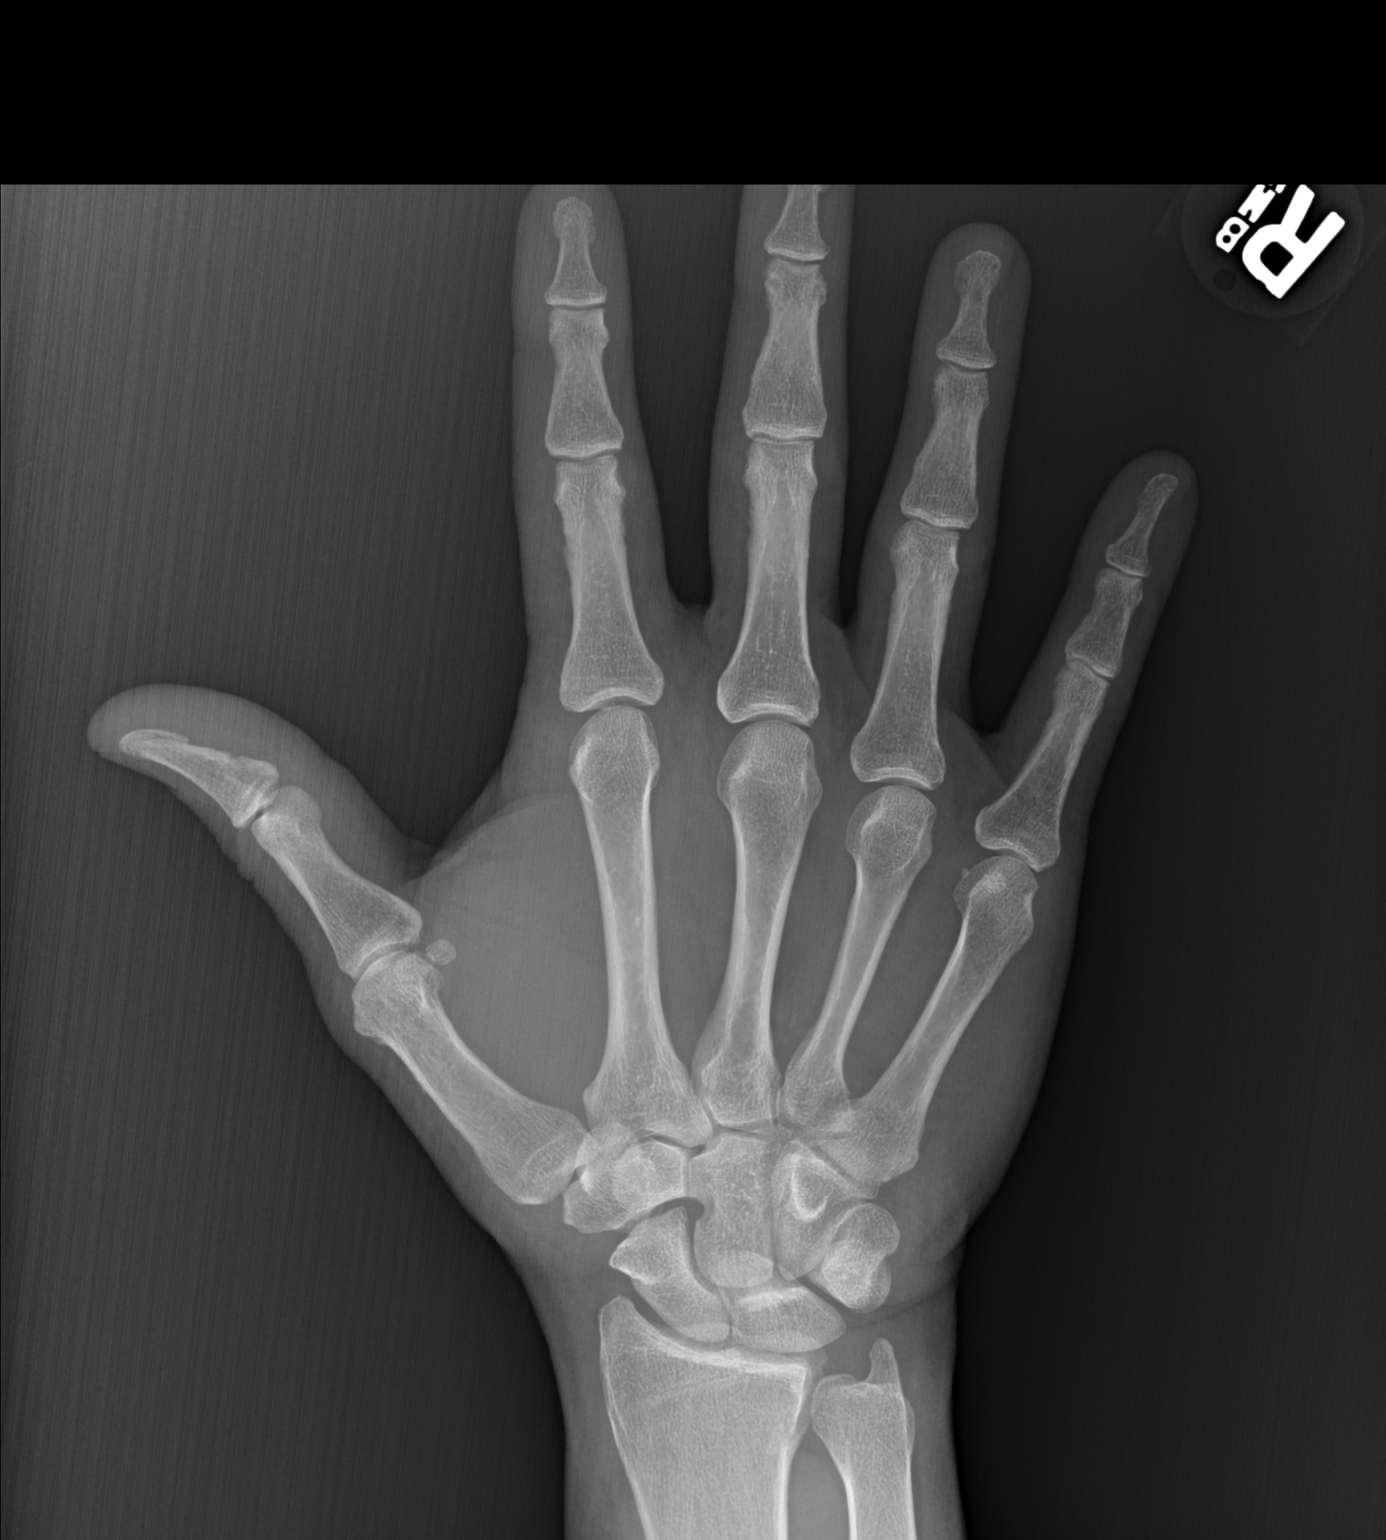

[hand obl (oblique)]
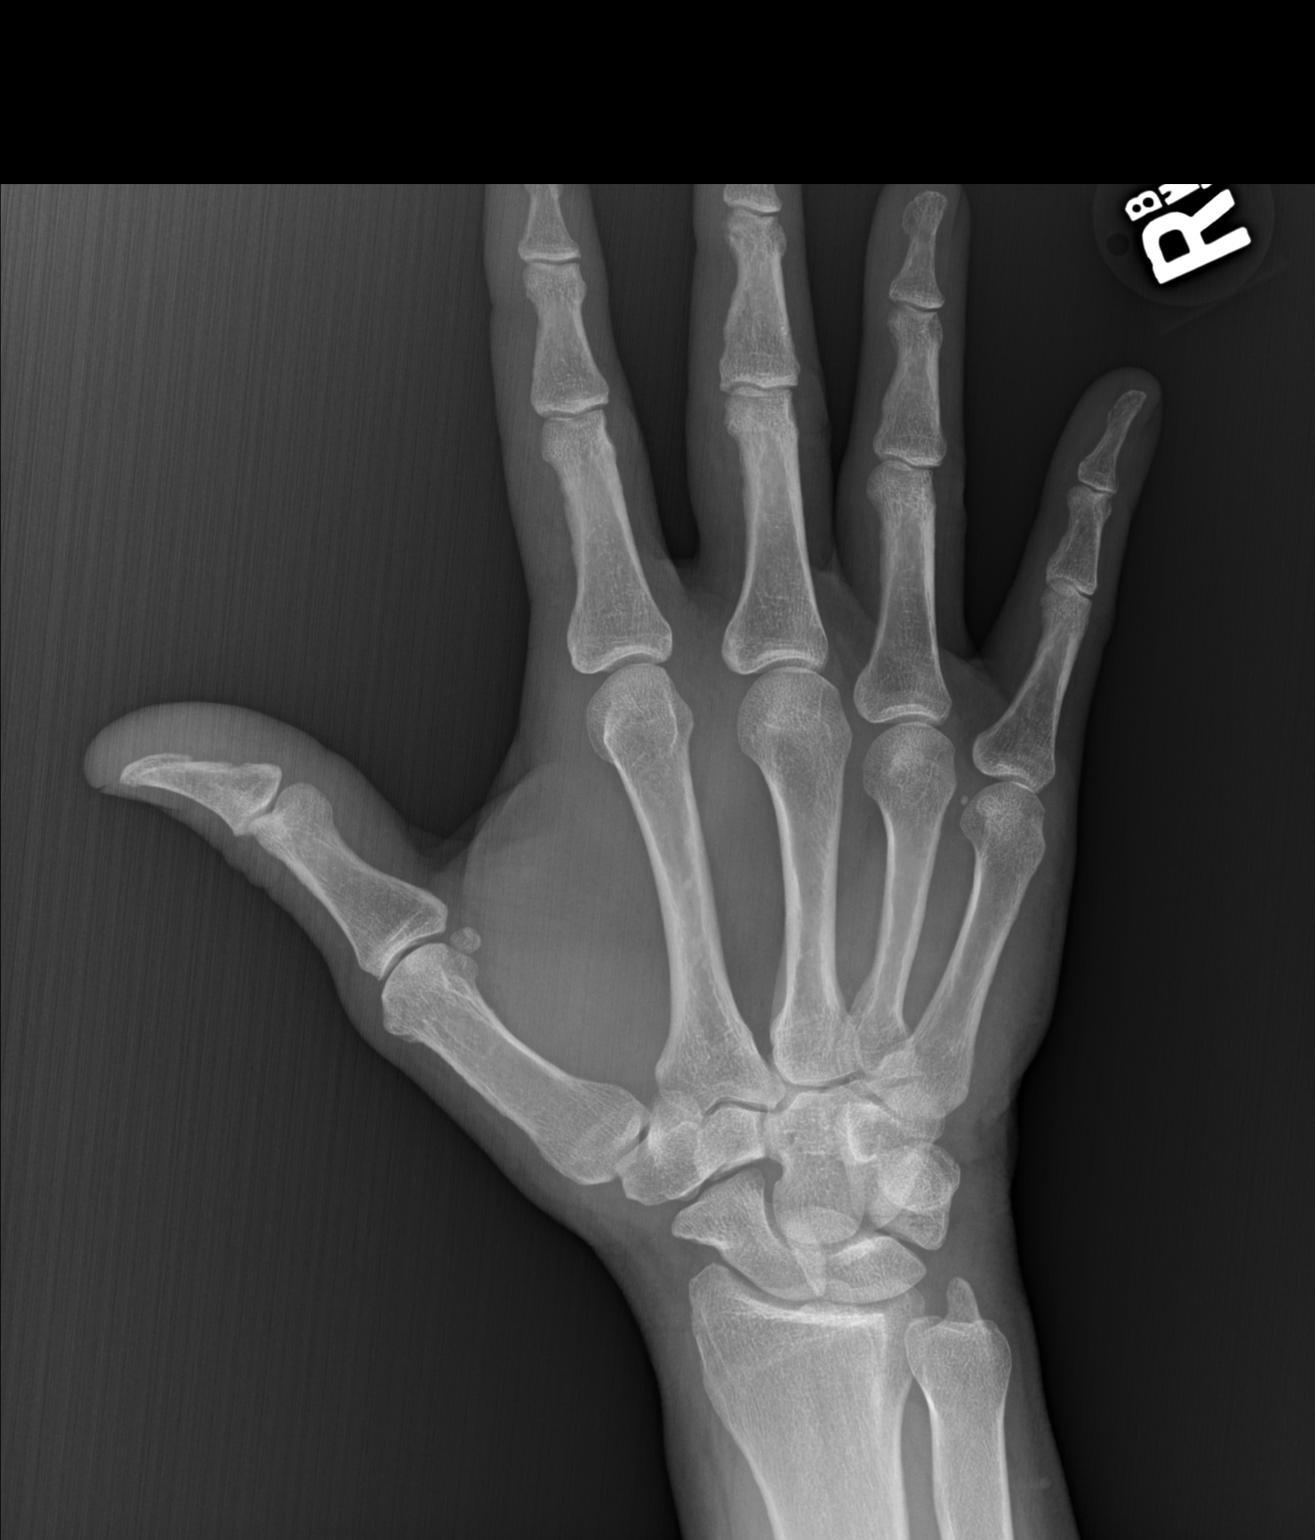

[hand lat]
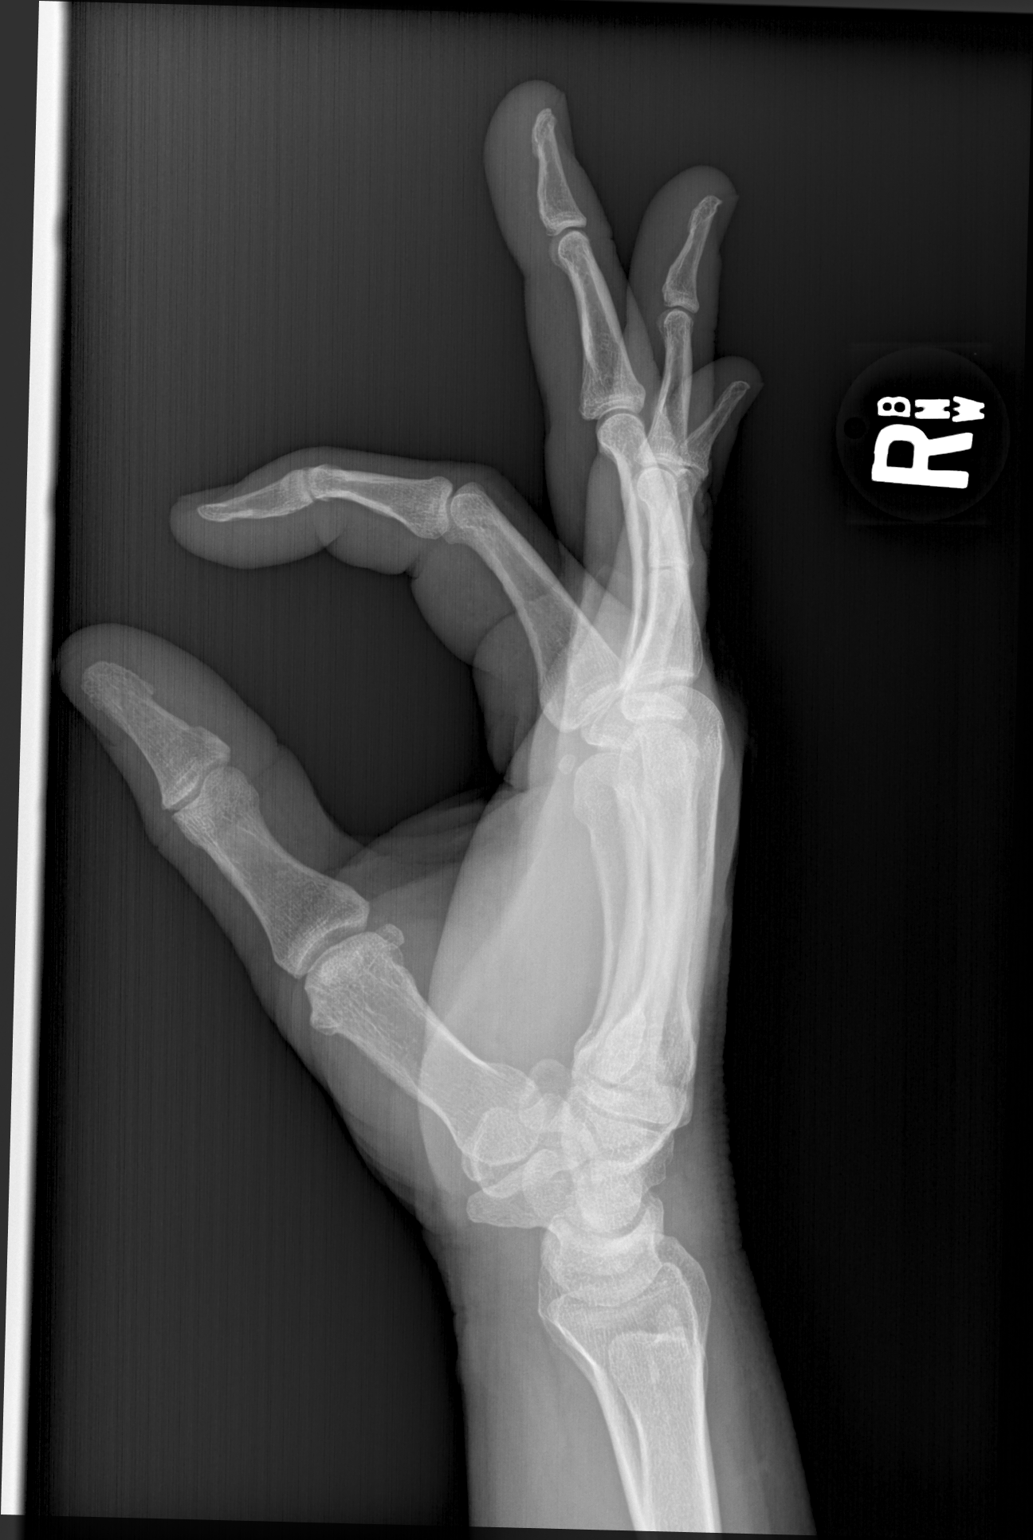

[3 of 3 positions shown; findings below may reference images not displayed]

FINDINGS: Osseous mineralization normal.

Joint spaces preserved.

No fracture, dislocation, or bone destruction.
IMPRESSION: Normal exam.

## 2019-04-28 IMAGING — DX DG ELBOW COMPLETE 3+V*L*
4 series · 4 of 4 positions shown · non-contrast
Comparison: None

CLINICAL DATA: BILATERAL elbow pain for several years, no known
injury or surgery prior surgery

EXAM:
LEFT ELBOW - COMPLETE 3+ VIEW

[elbow ap]
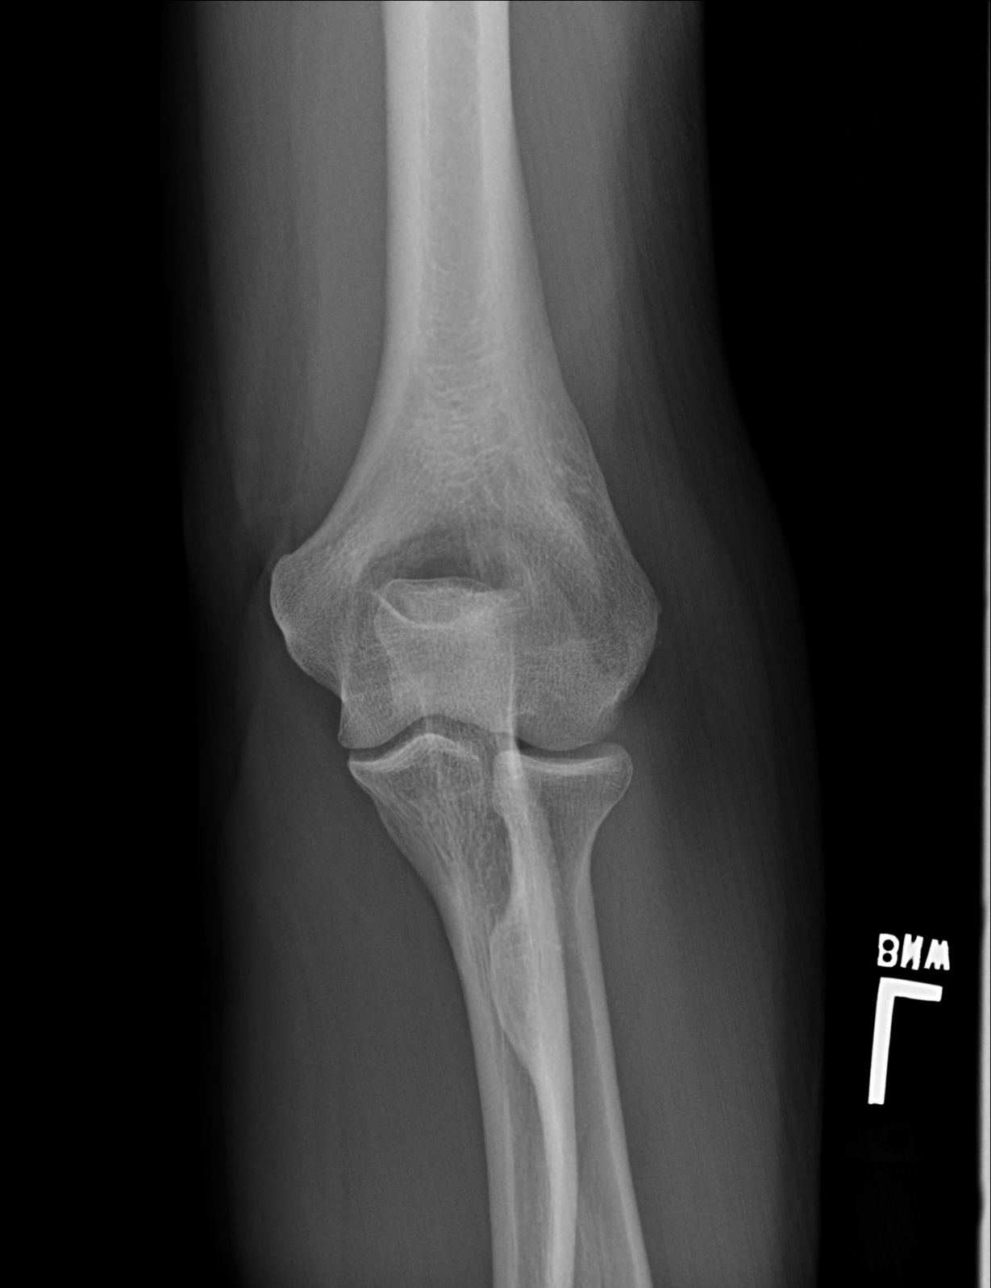

[elbow obl (oblique) (1 of 2)]
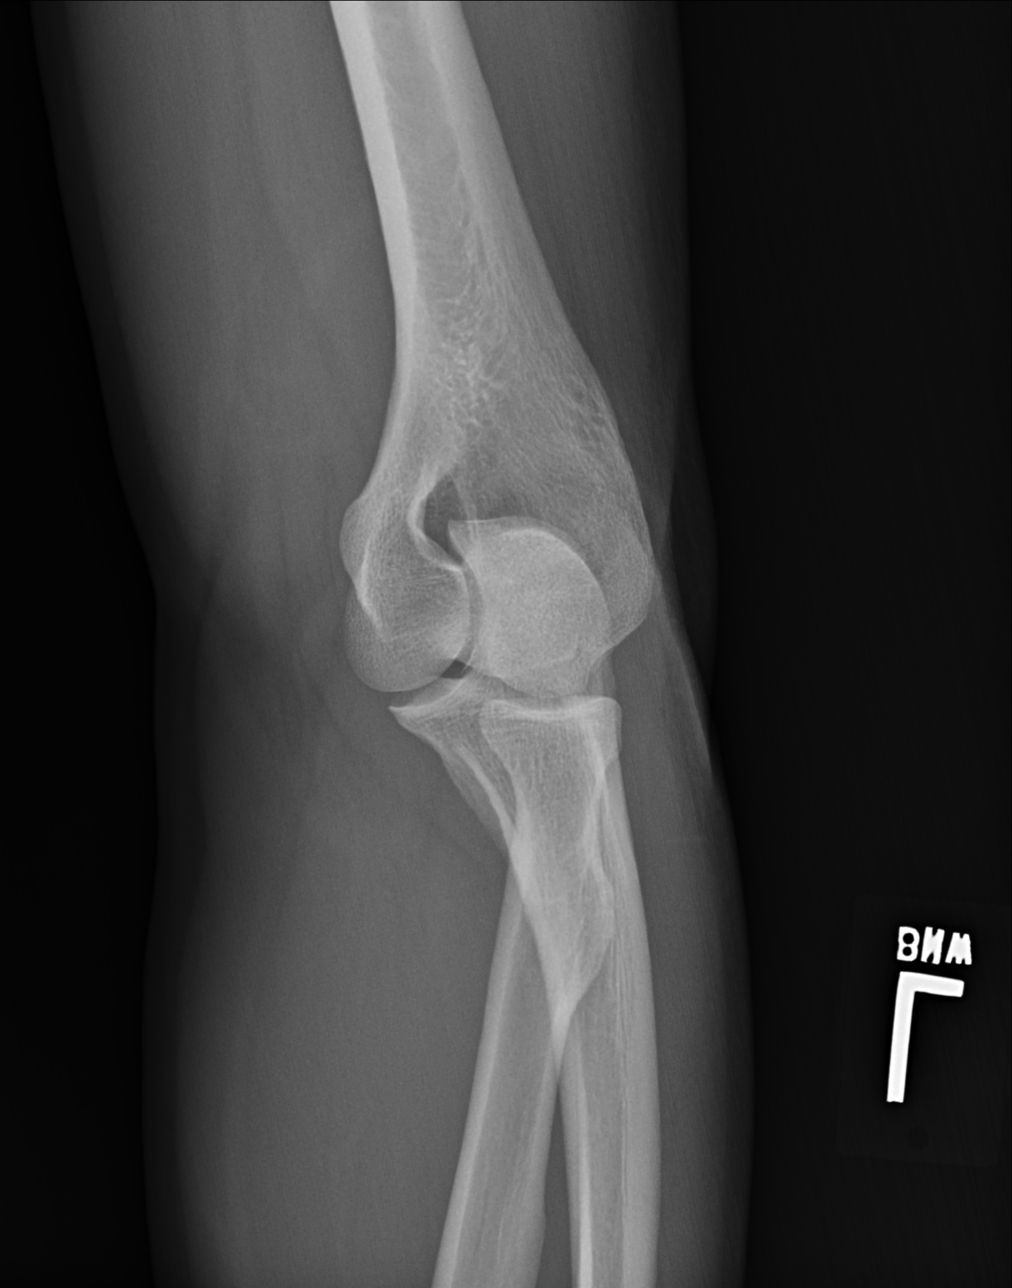

[elbow obl (oblique) (2 of 2)]
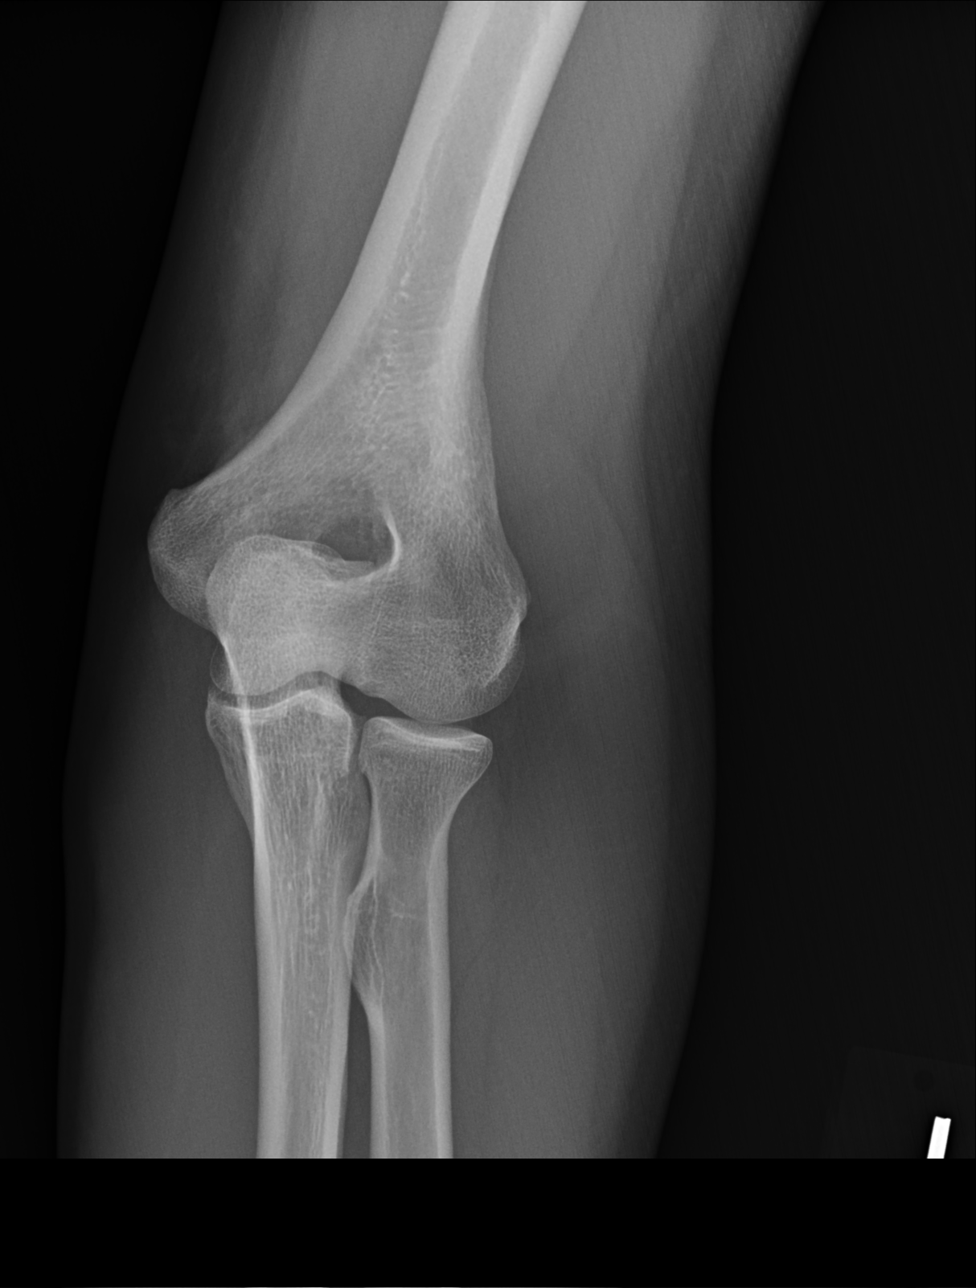

[elbow lat]
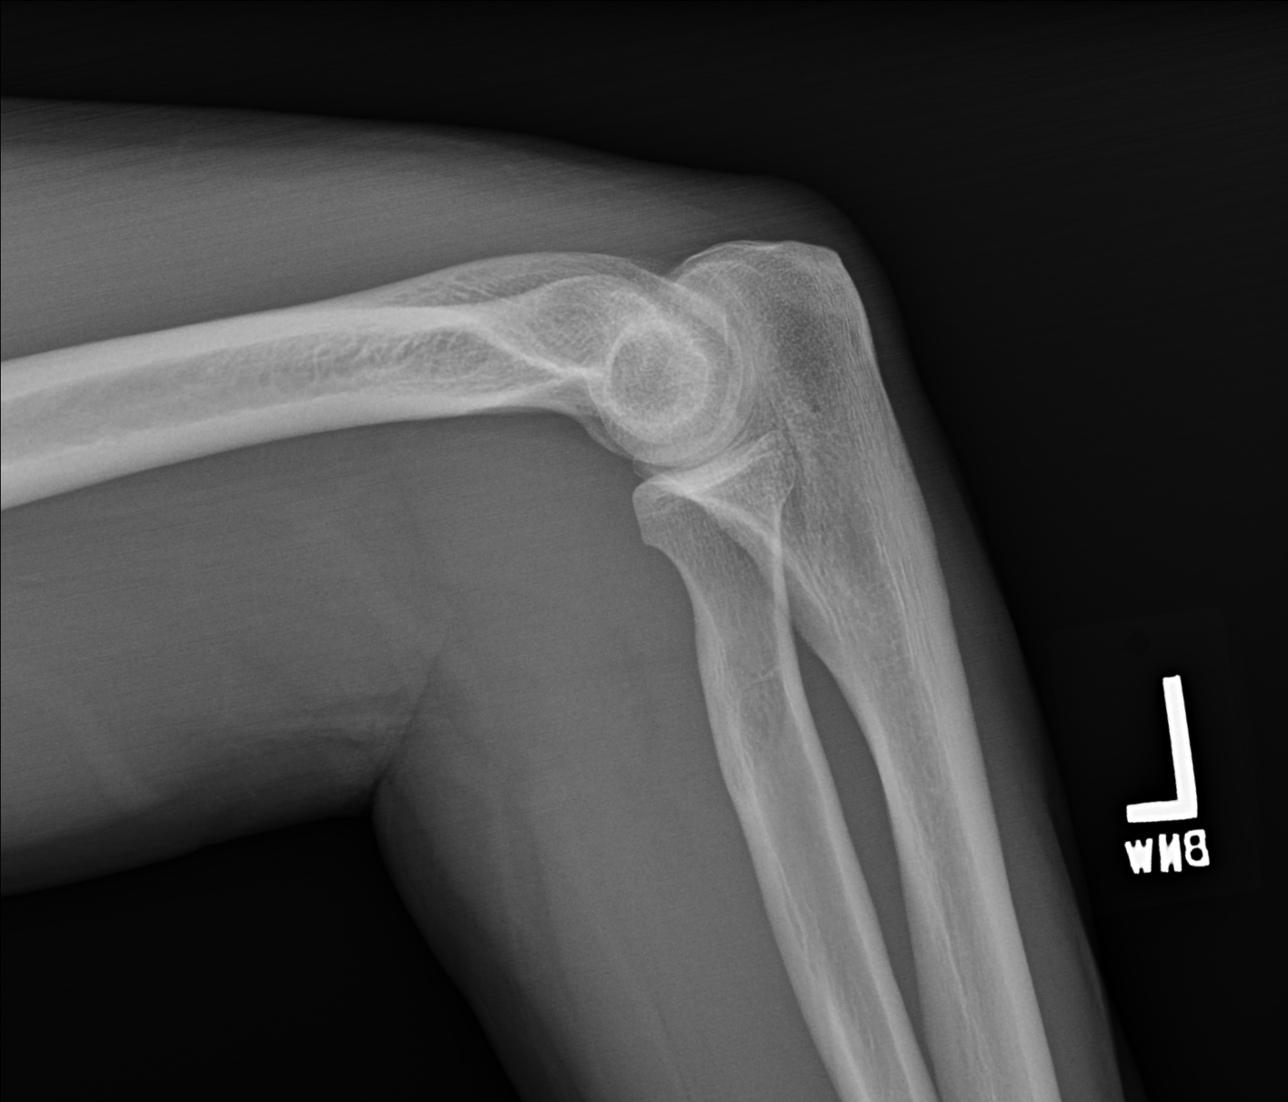

[4 of 4 positions shown; findings below may reference images not displayed]

FINDINGS: Osseous mineralization normal.

Joint spaces preserved.

No fracture, dislocation, or bone destruction.

No joint effusion.
IMPRESSION: Normal exam.

## 2019-05-09 ENCOUNTER — Other Ambulatory Visit: Payer: Self-pay

## 2019-05-09 ENCOUNTER — Telehealth: Payer: Self-pay | Admitting: Internal Medicine

## 2019-05-09 DIAGNOSIS — I10 Essential (primary) hypertension: Secondary | ICD-10-CM

## 2019-05-09 MED ORDER — HYDROCHLOROTHIAZIDE 25 MG PO TABS: 25.0000 mg | ORAL_TABLET | Freq: Every day | ORAL | 0 refills | Status: DC

## 2019-05-09 MED ORDER — LOSARTAN POTASSIUM 100 MG PO TABS
100.0000 mg | ORAL_TABLET | Freq: Every day | ORAL | 3 refills | Status: DC
Start: 2019-05-09 — End: 2020-04-10

## 2019-05-09 NOTE — Telephone Encounter (Signed)
Sen in medication for patient with note that appointment was needed for refills.

## 2019-05-09 NOTE — Telephone Encounter (Signed)
rx refill  losartan (COZAAR) 100 MG tablet  hydrochlorothiazide (HYDRODIURIL) 25 MG tablet    [facility]  [address]

## 2019-08-05 ENCOUNTER — Other Ambulatory Visit: Payer: Self-pay | Admitting: Internal Medicine

## 2019-08-05 DIAGNOSIS — I10 Essential (primary) hypertension: Secondary | ICD-10-CM

## 2019-08-05 MED ORDER — HYDROCHLOROTHIAZIDE 25 MG PO TABS: 25.0000 mg | ORAL_TABLET | Freq: Every day | ORAL | 0 refills | Status: DC

## 2019-08-19 ENCOUNTER — Other Ambulatory Visit: Payer: Self-pay | Admitting: Internal Medicine

## 2019-08-19 DIAGNOSIS — I10 Essential (primary) hypertension: Secondary | ICD-10-CM

## 2020-03-25 ENCOUNTER — Other Ambulatory Visit: Payer: Self-pay | Admitting: Internal Medicine

## 2020-03-25 ENCOUNTER — Telehealth: Payer: Self-pay | Admitting: Internal Medicine

## 2020-03-25 DIAGNOSIS — I10 Essential (primary) hypertension: Secondary | ICD-10-CM

## 2020-03-25 MED ORDER — LOSARTAN POTASSIUM-HCTZ 100-25 MG PO TABS: 1.0000 | ORAL_TABLET | Freq: Every day | ORAL | 0 refills | Status: DC

## 2020-03-25 NOTE — Telephone Encounter (Signed)
LVM to schedule med management appt

## 2020-03-25 NOTE — Telephone Encounter (Signed)
Call pt needs appt further refills meds

## 2020-03-26 ENCOUNTER — Other Ambulatory Visit: Payer: Self-pay

## 2020-03-26 DIAGNOSIS — I10 Essential (primary) hypertension: Secondary | ICD-10-CM

## 2020-03-26 MED ORDER — HYDROCHLOROTHIAZIDE 25 MG PO TABS: ORAL_TABLET | ORAL | 1 refills | Status: DC

## 2020-04-08 ENCOUNTER — Telehealth: Payer: Self-pay | Admitting: Internal Medicine

## 2020-04-08 ENCOUNTER — Other Ambulatory Visit: Payer: Self-pay | Admitting: Internal Medicine

## 2020-04-08 ENCOUNTER — Other Ambulatory Visit: Payer: Self-pay

## 2020-04-08 DIAGNOSIS — I10 Essential (primary) hypertension: Secondary | ICD-10-CM

## 2020-04-10 ENCOUNTER — Encounter: Payer: Self-pay | Admitting: Internal Medicine

## 2020-04-10 ENCOUNTER — Other Ambulatory Visit: Payer: Self-pay

## 2020-04-10 ENCOUNTER — Ambulatory Visit (INDEPENDENT_AMBULATORY_CARE_PROVIDER_SITE_OTHER): Payer: BC Managed Care – PPO | Admitting: Internal Medicine

## 2020-04-10 VITALS — BP 120/76 | HR 76 | Temp 98.4°F | Ht 71.0 in | Wt 238.0 lb

## 2020-04-10 DIAGNOSIS — B002 Herpesviral gingivostomatitis and pharyngotonsillitis: Secondary | ICD-10-CM | POA: Diagnosis not present

## 2020-04-10 DIAGNOSIS — R739 Hyperglycemia, unspecified: Secondary | ICD-10-CM

## 2020-04-10 DIAGNOSIS — M549 Dorsalgia, unspecified: Secondary | ICD-10-CM | POA: Diagnosis not present

## 2020-04-10 DIAGNOSIS — Z8616 Personal history of COVID-19: Secondary | ICD-10-CM

## 2020-04-10 DIAGNOSIS — Z125 Encounter for screening for malignant neoplasm of prostate: Secondary | ICD-10-CM | POA: Diagnosis not present

## 2020-04-10 DIAGNOSIS — I10 Essential (primary) hypertension: Secondary | ICD-10-CM | POA: Diagnosis not present

## 2020-04-10 DIAGNOSIS — Z0000 Encounter for general adult medical examination without abnormal findings: Secondary | ICD-10-CM | POA: Diagnosis not present

## 2020-04-10 DIAGNOSIS — M545 Low back pain, unspecified: Secondary | ICD-10-CM | POA: Diagnosis not present

## 2020-04-10 DIAGNOSIS — Z13818 Encounter for screening for other digestive system disorders: Secondary | ICD-10-CM

## 2020-04-10 DIAGNOSIS — Z1329 Encounter for screening for other suspected endocrine disorder: Secondary | ICD-10-CM

## 2020-04-10 DIAGNOSIS — Z1389 Encounter for screening for other disorder: Secondary | ICD-10-CM

## 2020-04-10 DIAGNOSIS — Z0184 Encounter for antibody response examination: Secondary | ICD-10-CM

## 2020-04-10 DIAGNOSIS — K760 Fatty (change of) liver, not elsewhere classified: Secondary | ICD-10-CM

## 2020-04-10 DIAGNOSIS — E559 Vitamin D deficiency, unspecified: Secondary | ICD-10-CM

## 2020-04-10 MED ORDER — CYCLOBENZAPRINE HCL 5 MG PO TABS: 2.5000 mg | ORAL_TABLET | Freq: Every evening | ORAL | 0 refills | Status: DC | PRN

## 2020-04-10 MED ORDER — VALACYCLOVIR HCL 1 G PO TABS: 2000.0000 mg | ORAL_TABLET | Freq: Two times a day (BID) | ORAL | 11 refills | Status: DC

## 2020-04-10 MED ORDER — LOSARTAN POTASSIUM-HCTZ 100-25 MG PO TABS: 1.0000 | ORAL_TABLET | Freq: Every day | ORAL | 3 refills | Status: DC

## 2020-04-10 MED ORDER — ACYCLOVIR 5 % EX OINT: 1.0000 "application " | TOPICAL_OINTMENT | Freq: Four times a day (QID) | CUTANEOUS | 11 refills | Status: DC | PRN

## 2020-04-10 NOTE — Patient Instructions (Addendum)
Let me know when ready for colonoscopy referral  L lysine    Low Back Sprain or Strain Rehab Ask your health care provider which exercises are safe for you. Do exercises exactly as told by your health care provider and adjust them as directed. It is normal to feel mild stretching, pulling, tightness, or discomfort as you do these exercises. Stop right away if you feel sudden pain or your pain gets worse. Do not begin these exercises until told by your health care provider. Stretching and range-of-motion exercises These exercises warm up your muscles and joints and improve the movement and flexibility of your back. These exercises also help to relieve pain, numbness, and tingling. Lumbar rotation  1. Lie on your back on a firm surface and bend your knees. 2. Straighten your arms out to your sides so each arm forms a 90-degree angle (right angle) with a side of your body. 3. Slowly move (rotate) both of your knees to one side of your body until you feel a stretch in your lower back (lumbar). Try not to let your shoulders lift off the floor. 4. Hold this position for __________ seconds. 5. Tense your abdominal muscles and slowly move your knees back to the starting position. 6. Repeat this exercise on the other side of your body. Repeat __________ times. Complete this exercise __________ times a day. Single knee to chest  1. Lie on your back on a firm surface with both legs straight. 2. Bend one of your knees. Use your hands to move your knee up toward your chest until you feel a gentle stretch in your lower back and buttock. ? Hold your leg in this position by holding on to the front of your knee. ? Keep your other leg as straight as possible. 3. Hold this position for __________ seconds. 4. Slowly return to the starting position. 5. Repeat with your other leg. Repeat __________ times. Complete this exercise __________ times a day. Prone extension on elbows  1. Lie on your abdomen on a  firm surface (prone position). 2. Prop yourself up on your elbows. 3. Use your arms to help lift your chest up until you feel a gentle stretch in your abdomen and your lower back. ? This will place some of your body weight on your elbows. If this is uncomfortable, try stacking pillows under your chest. ? Your hips should stay down, against the surface that you are lying on. Keep your hip and back muscles relaxed. 4. Hold this position for __________ seconds. 5. Slowly relax your upper body and return to the starting position. Repeat __________ times. Complete this exercise __________ times a day. Strengthening exercises These exercises build strength and endurance in your back. Endurance is the ability to use your muscles for a long time, even after they get tired. Pelvic tilt This exercise strengthens the muscles that lie deep in the abdomen. 1. Lie on your back on a firm surface. Bend your knees and keep your feet flat on the floor. 2. Tense your abdominal muscles. Tip your pelvis up toward the ceiling and flatten your lower back into the floor. ? To help with this exercise, you may place a small towel under your lower back and try to push your back into the towel. 3. Hold this position for __________ seconds. 4. Let your muscles relax completely before you repeat this exercise. Repeat __________ times. Complete this exercise __________ times a day. Alternating arm and leg raises  1. Get on your hands  and knees on a firm surface. If you are on a hard floor, you may want to use padding, such as an exercise mat, to cushion your knees. 2. Line up your arms and legs. Your hands should be directly below your shoulders, and your knees should be directly below your hips. 3. Lift your left leg behind you. At the same time, raise your right arm and straighten it in front of you. ? Do not lift your leg higher than your hip. ? Do not lift your arm higher than your shoulder. ? Keep your abdominal and  back muscles tight. ? Keep your hips facing the ground. ? Do not arch your back. ? Keep your balance carefully, and do not hold your breath. 4. Hold this position for __________ seconds. 5. Slowly return to the starting position. 6. Repeat with your right leg and your left arm. Repeat __________ times. Complete this exercise __________ times a day. Abdominal set with straight leg raise  1. Lie on your back on a firm surface. 2. Bend one of your knees and keep your other leg straight. 3. Tense your abdominal muscles and lift your straight leg up, 4-6 inches (10-15 cm) off the ground. 4. Keep your abdominal muscles tight and hold this position for __________ seconds. ? Do not hold your breath. ? Do not arch your back. Keep it flat against the ground. 5. Keep your abdominal muscles tense as you slowly lower your leg back to the starting position. 6. Repeat with your other leg. Repeat __________ times. Complete this exercise __________ times a day. Single leg lower with bent knees 1. Lie on your back on a firm surface. 2. Tense your abdominal muscles and lift your feet off the floor, one foot at a time, so your knees and hips are bent in 90-degree angles (right angles). ? Your knees should be over your hips and your lower legs should be parallel to the floor. 3. Keeping your abdominal muscles tense and your knee bent, slowly lower one of your legs so your toe touches the ground. 4. Lift your leg back up to return to the starting position. ? Do not hold your breath. ? Do not let your back arch. Keep your back flat against the ground. 5. Repeat with your other leg. Repeat __________ times. Complete this exercise __________ times a day. Posture and body mechanics Good posture and healthy body mechanics can help to relieve stress in your body's tissues and joints. Body mechanics refers to the movements and positions of your body while you do your daily activities. Posture is part of body  mechanics. Good posture means:  Your spine is in its natural S-curve position (neutral).  Your shoulders are pulled back slightly.  Your head is not tipped forward. Follow these guidelines to improve your posture and body mechanics in your everyday activities. Standing   When standing, keep your spine neutral and your feet about hip width apart. Keep a slight bend in your knees. Your ears, shoulders, and hips should line up.  When you do a task in which you stand in one place for a long time, place one foot up on a stable object that is 2-4 inches (5-10 cm) high, such as a footstool. This helps keep your spine neutral. Sitting   When sitting, keep your spine neutral and keep your feet flat on the floor. Use a footrest, if necessary, and keep your thighs parallel to the floor. Avoid rounding your shoulders, and avoid tilting your  head forward.  When working at a desk or a computer, keep your desk at a height where your hands are slightly lower than your elbows. Slide your chair under your desk so you are close enough to maintain good posture.  When working at a computer, place your monitor at a height where you are looking straight ahead and you do not have to tilt your head forward or downward to look at the screen. Resting  When lying down and resting, avoid positions that are most painful for you.  If you have pain with activities such as sitting, bending, stooping, or squatting, lie in a position in which your body does not bend very much. For example, avoid curling up on your side with your arms and knees near your chest (fetal position).  If you have pain with activities such as standing for a long time or reaching with your arms, lie with your spine in a neutral position and bend your knees slightly. Try the following positions: ? Lying on your side with a pillow between your knees. ? Lying on your back with a pillow under your knees. Lifting   When lifting objects, keep your  feet at least shoulder width apart and tighten your abdominal muscles.  Bend your knees and hips and keep your spine neutral. It is important to lift using the strength of your legs, not your back. Do not lock your knees straight out.  Always ask for help to lift heavy or awkward objects. This information is not intended to replace advice given to you by your health care provider. Make sure you discuss any questions you have with your health care provider. Document Revised: 09/28/2018 Document Reviewed: 06/28/2018 Elsevier Patient Education  2020 [employer].  Thoracic Strain Rehab Ask your health care provider which exercises are safe for you. Do exercises exactly as told by your health care provider and adjust them as directed. It is normal to feel mild stretching, pulling, tightness, or discomfort as you do these exercises. Stop right away if you feel sudden pain or your pain gets worse. Do not begin these exercises until told by your health care provider. Stretching and range-of-motion exercise This exercise warms up your muscles and joints and improves the movement and flexibility of your back and shoulders. This exercise also helps to relieve pain. Chest and spine stretch  7. Lie down on your back on a firm surface. 8. Roll a towel or a small blanket so it is about 4 inches (10 cm) in diameter. 9. Put the towel lengthwise under the middle of your back so it is under your spine, but not under your shoulder blades. 10. Put your hands behind your head and let your elbows fall to your sides. This will increase your stretch. 11. Take a deep breath (inhale). 12. Hold for __________ seconds. 13. Relax after you breathe out (exhale). Repeat __________ times. Complete this exercise __________ times a day. Strengthening exercises These exercises build strength and endurance in your back and your shoulder blade muscles. Endurance is the ability to use your muscles for a long time, even after  they get tired. Alternating arm and leg raises  6. Get on your hands and knees on a firm surface. If you are on a hard floor, you may want to use padding, such as an exercise mat, to cushion your knees. 7. Line up your arms and legs. Your hands should be directly below your shoulders, and your knees should be directly below your  hips. 8. Lift your left leg behind you. At the same time, raise your right arm and straighten it in front of you. ? Do not lift your leg higher than your hip. ? Do not lift your arm higher than your shoulder. ? Keep your abdominal and back muscles tight. ? Keep your hips facing the ground. ? Do not arch your back. ? Keep your balance carefully, and do not hold your breath. 9. Hold for __________ seconds. 10. Slowly return to the starting position and repeat with your right leg and your left arm. Repeat __________ times. Complete this exercise __________ times a day. Straight arm rows This exercise is also called shoulder extension exercise. 6. Stand with your feet shoulder width apart. 7. Secure an exercise band to a stable object in front of you so the band is at or above shoulder height. 8. Hold one end of the exercise band in each hand. 9. Straighten your elbows and lift your hands up to shoulder height. 10. Step back, away from the secured end of the exercise band, until the band stretches. 11. Squeeze your shoulder blades together and pull your hands down to the sides of your thighs. Stop when your hands are straight down by your sides. This is shoulder extension. Do not let your hands go behind your body. 12. Hold for __________ seconds. 13. Slowly return to the starting position. Repeat __________ times. Complete this exercise __________ times a day. Prone shoulder external rotation 5. Lie on your abdomen on a firm bed so your left / right forearm hangs over the edge of the bed and your upper arm is on the bed, straight out from your body. This is the prone  position. ? Your elbow should be bent. ? Your palm should be facing your feet. 6. If instructed, hold a __________ weight in your hand. 7. Squeeze your shoulder blade toward the middle of your back. Do not let your shoulder lift toward your ear. 8. Keep your elbow bent in a 90-degree angle (right angle) while you slowly move your forearm up toward the ceiling. Move your forearm up to the height of the bed, toward your head. This is external rotation. ? Your upper arm should not move. ? At the top of the movement, your palm should face the floor. 9. Hold for __________ seconds. 10. Slowly return to the starting position and relax your muscles. Repeat __________ times. Complete this exercise __________ times a day. Rowing scapular retraction This is an exercise in which the shoulder blades (scapulae) are pulled toward each other (retraction). 7. Sit in a stable chair without armrests, or stand up. 8. Secure an exercise band to a stable object in front of you so the band is at shoulder height. 9. Hold one end of the exercise band in each hand. Your palms should face down. 10. Bring your arms out straight in front of you. 11. Step back, away from the secured end of the exercise band, until the band stretches. 12. Pull the band backward. As you do this, bend your elbows and squeeze your shoulder blades together, but avoid letting the rest of your body move. Do not shrug your shoulders upward while you do this. 13. Stop when your elbows are at your sides or slightly behind your body. 14. Hold for __________ seconds. 15. Slowly straighten your arms to return to the starting position. Repeat __________ times. Complete this exercise __________ times a day. Posture and body mechanics Good posture and healthy body  mechanics can help to relieve stress in your body's tissues and joints. Body mechanics refers to the movements and positions of your body while you do your daily activities. Posture is part of  body mechanics. Good posture means:  Your spine is in its natural S-curve position (neutral).  Your shoulders are pulled back slightly.  Your head is not tipped forward. Follow these guidelines to improve your posture and body mechanics in your everyday activities. Standing   When standing, keep your spine neutral and your feet about hip width apart. Keep a slight bend in your knees. Your ears, shoulders, and hips should line up with each other.  When you do a task in which you lean forward while standing in one place for a long time, place one foot up on a stable object that is 2-4 inches (5-10 cm) high, such as a footstool. This helps keep your spine neutral. Sitting   When sitting, keep your spine neutral and keep your feet flat on the floor. Use a footrest, if necessary, and keep your thighs parallel to the floor. Avoid rounding your shoulders, and avoid tilting your head forward.  When working at a desk or a computer, keep your desk at a height where your hands are slightly lower than your elbows. Slide your chair under your desk so you are close enough to maintain good posture.  When working at a computer, place your monitor at a height where you are looking straight ahead and you do not have to tilt your head forward or downward to look at the screen. Resting When lying down and resting, avoid positions that are most painful for you.  If you have pain with activities such as sitting, bending, stooping, or squatting (flexion-basedactivities), lie in a position in which your body does not bend very much. For example, avoid curling up on your side with your arms and knees near your chest (fetal position).  If you have pain with activities such as standing for a long time or reaching with your arms (extension-basedactivities), lie with your spine in a neutral position and bend your knees slightly. Try the following positions: ? Lie on your side with a pillow between your knees. ? Lie  on your back with a pillow under your knees.  Lifting   When lifting objects, keep your feet at least shoulder width apart and tighten your abdominal muscles.  Bend your knees and hips and keep your spine neutral. It is important to lift using the strength of your legs, not your back. Do not lock your knees straight out.  Always ask for help to lift heavy or awkward objects. This information is not intended to replace advice given to you by your health care provider. Make sure you discuss any questions you have with your health care provider. Document Revised: 09/28/2018 Document Reviewed: 07/16/2018 Elsevier Patient Education  2020 [employer].   Colonoscopy, Adult A colonoscopy is a procedure to look at the entire large intestine. This procedure is done using a long, thin, flexible tube that has a camera on the end. You may have a colonoscopy:  As a part of normal colorectal screening.  If you have certain symptoms, such as: ? A low number of red blood cells in your blood (anemia). ? Diarrhea that does not go away. ? Pain in your abdomen. ? Blood in your stool. A colonoscopy can help screen for and diagnose medical problems, including:  Tumors.  Extra tissue that grows where mucus forms (polyps).  Inflammation.  Areas of bleeding. Tell your health care provider about:  Any allergies you have.  All medicines you are taking, including vitamins, herbs, eye drops, creams, and over-the-counter medicines.  Any problems you or family members have had with anesthetic medicines.  Any blood disorders you have.  Any surgeries you have had.  Any medical conditions you have.  Any problems you have had with having bowel movements.  Whether you are pregnant or may be pregnant. What are the risks? Generally, this is a safe procedure. However, problems may occur, including:  Bleeding.  Damage to your intestine.  Allergic reactions to medicines given during the  procedure.  Infection. This is rare. What happens before the procedure? Eating and drinking restrictions Follow instructions from your health care provider about eating or drinking restrictions, which may include:  A few days before the procedure: ? Follow a low-fiber diet. ? Avoid nuts, seeds, dried fruit, raw fruits, and vegetables.  1-3 days before the procedure: ? Eat only gelatin dessert or ice pops. ? Drink only clear liquids, such as water, clear juice, clear broth or bouillon, black coffee or tea, or clear soft drinks or sports drinks. ? Avoid liquids that contain red or purple dye.  The day of the procedure: ? Do not eat solid foods. You may continue to drink clear liquids until up to 2 hours before the procedure. ? Do not eat or drink anything starting 2 hours before the procedure, or within the time period that your health care provider recommends. Bowel prep If you were prescribed a bowel prep to take by mouth (orally) to clean out your colon:  Take it as told by your health care provider. Starting the day before your procedure, you will need to drink a large amount of liquid medicine. The liquid will cause you to have many bowel movements of loose stool until your stool becomes almost clear or light green.  If your skin or the opening between the buttocks (anus) gets irritated from diarrhea, you may relieve the irritation using: ? Wipes with medicine in them, such as adult wet wipes with aloe and vitamin E. ? A product to soothe skin, such as petroleum jelly.  If you vomit while drinking the bowel prep: ? Take a break for up to 60 minutes. ? Begin the bowel prep again. ? Call your health care provider if you keep vomiting or you cannot take the bowel prep without vomiting.  To clean out your colon, you may also be given: ? Laxative medicines. These help you have a bowel movement. ? Instructions for enema use. An enema is liquid medicine injected into your  rectum. Medicines Ask your health care provider about:  Changing or stopping your regular medicines or supplements. This is especially important if you are taking iron supplements, diabetes medicines, or blood thinners.  Taking medicines such as aspirin and ibuprofen. These medicines can thin your blood. Do not take these medicines unless your health care provider tells you to take them.  Taking over-the-counter medicines, vitamins, herbs, and supplements. General instructions  Ask your health care provider what steps will be taken to help prevent infection. These may include washing skin with a germ-killing soap.  Plan to have someone take you home from the hospital or clinic. What happens during the procedure?   An IV will be inserted into one of your veins.  You may be given one or more of the following: ? A medicine to help you relax (sedative). ?  A medicine to numb the area (local anesthetic). ? A medicine to make you fall asleep (general anesthetic). This is rarely needed.  You will lie on your side with your knees bent.  The tube will: ? Have oil or gel put on it (be lubricated). ? Be inserted into your anus. ? Be gently eased through all parts of your large intestine.  Air will be sent into your colon to keep it open. This may cause some pressure or cramping.  Images will be taken with the camera and will appear on a screen.  A small tissue sample may be removed to be looked at under a microscope (biopsy). The tissue may be sent to a lab for testing if any signs of problems are found.  If small polyps are found, they may be removed and checked for cancer cells.  When the procedure is finished, the tube will be removed. The procedure may vary among health care providers and hospitals. What happens after the procedure?  Your blood pressure, heart rate, breathing rate, and blood oxygen level will be monitored until you leave the hospital or clinic.  You may have a  small amount of blood in your stool.  You may pass gas and have mild cramping or bloating in your abdomen. This is caused by the air that was used to open your colon during the exam.  Do not drive for 24 hours after the procedure.  It is up to you to get the results of your procedure. Ask your health care provider, or the department that is doing the procedure, when your results will be ready. Summary  A colonoscopy is a procedure to look at the entire large intestine.  Follow instructions from your health care provider about eating and drinking before the procedure.  If you were prescribed an oral bowel prep to clean out your colon, take it as told by your health care provider.  During the colonoscopy, a flexible tube with a camera on its end is inserted into the anus and then passed into the other parts of the large intestine. This information is not intended to replace advice given to you by your health care provider. Make sure you discuss any questions you have with your health care provider. Document Revised: 12/28/2018 Document Reviewed: 12/28/2018 Elsevier Patient Education  2020 [employer]  DASH Eating Plan DASH stands for "Dietary Approaches to Stop Hypertension." The DASH eating plan is a healthy eating plan that has been shown to reduce high blood pressure (hypertension). It may also reduce your risk for type 2 diabetes, heart disease, and stroke. The DASH eating plan may also help with weight loss. What are tips for following this plan?  General guidelines  Avoid eating more than 2,300 mg (milligrams) of salt (sodium) a day. If you have hypertension, you may need to reduce your sodium intake to 1,500 mg a day.  Limit alcohol intake to no more than 1 drink a day for nonpregnant women and 2 drinks a day for men. One drink equals 12 oz of beer, 5 oz of wine, or 1 oz of hard liquor.  Work with your health care provider to maintain a healthy body weight or to lose weight.  Ask what an ideal weight is for you.  Get at least 30 minutes of exercise that causes your heart to beat faster (aerobic exercise) most days of the week. Activities may include walking, swimming, or biking.  Work with your health care provider or diet and  nutrition specialist (dietitian) to adjust your eating plan to your individual calorie needs. Reading food labels   Check food labels for the amount of sodium per serving. Choose foods with less than 5 percent of the Daily Value of sodium. Generally, foods with less than 300 mg of sodium per serving fit into this eating plan.  To find whole grains, look for the word "whole" as the first word in the ingredient list. Shopping  Buy products labeled as "low-sodium" or "no salt added."  Buy fresh foods. Avoid canned foods and premade or frozen meals. Cooking  Avoid adding salt when cooking. Use salt-free seasonings or herbs instead of table salt or sea salt. Check with your health care provider or pharmacist before using salt substitutes.  Do not fry foods. Cook foods using healthy methods such as baking, boiling, grilling, and broiling instead.  Cook with heart-healthy oils, such as olive, canola, soybean, or sunflower oil. Meal planning  Eat a balanced diet that includes: ? 5 or more servings of fruits and vegetables each day. At each meal, try to fill half of your plate with fruits and vegetables. ? Up to 6-8 servings of whole grains each day. ? Less than 6 oz of lean meat, poultry, or fish each day. A 3-oz serving of meat is about the same size as a deck of cards. One egg equals 1 oz. ? 2 servings of low-fat dairy each day. ? A serving of nuts, seeds, or beans 5 times each week. ? Heart-healthy fats. Healthy fats called Omega-3 fatty acids are found in foods such as flaxseeds and coldwater fish, like sardines, salmon, and mackerel.  Limit how much you eat of the following: ? Canned or prepackaged foods. ? Food that is high in trans  fat, such as fried foods. ? Food that is high in saturated fat, such as fatty meat. ? Sweets, desserts, sugary drinks, and other foods with added sugar. ? Full-fat dairy products.  Do not salt foods before eating.  Try to eat at least 2 vegetarian meals each week.  Eat more home-cooked food and less restaurant, buffet, and fast food.  When eating at a restaurant, ask that your food be prepared with less salt or no salt, if possible. What foods are recommended? The items listed may not be a complete list. Talk with your dietitian about what dietary choices are best for you. Grains Whole-grain or whole-wheat bread. Whole-grain or whole-wheat pasta. Brown rice. Orpah Cobb. Bulgur. Whole-grain and low-sodium cereals. Pita bread. Low-fat, low-sodium crackers. Whole-wheat flour tortillas. Vegetables Fresh or frozen vegetables (raw, steamed, roasted, or grilled). Low-sodium or reduced-sodium tomato and vegetable juice. Low-sodium or reduced-sodium tomato sauce and tomato paste. Low-sodium or reduced-sodium canned vegetables. Fruits All fresh, dried, or frozen fruit. Canned fruit in natural juice (without added sugar). Meat and other protein foods Skinless chicken or [place]. Ground chicken or [place]. Pork with fat trimmed off. Fish and seafood. Egg whites. Dried beans, peas, or lentils. Unsalted nuts, nut butters, and seeds. Unsalted canned beans. Lean cuts of beef with fat trimmed off. Low-sodium, lean deli meat. Dairy Low-fat (1%) or fat-free (skim) milk. Fat-free, low-fat, or reduced-fat cheeses. Nonfat, low-sodium ricotta or cottage cheese. Low-fat or nonfat yogurt. Low-fat, low-sodium cheese. Fats and oils Soft margarine without trans fats. Vegetable oil. Low-fat, reduced-fat, or light mayonnaise and salad dressings (reduced-sodium). Canola, safflower, olive, soybean, and sunflower oils. Avocado. Seasoning and other foods Herbs. Spices. Seasoning mixes without salt. Unsalted popcorn and  pretzels. Fat-free sweets. What  foods are not recommended? The items listed may not be a complete list. Talk with your dietitian about what dietary choices are best for you. Grains Baked goods made with fat, such as croissants, muffins, or some breads. Dry pasta or rice meal packs. Vegetables Creamed or fried vegetables. Vegetables in a cheese sauce. Regular canned vegetables (not low-sodium or reduced-sodium). Regular canned tomato sauce and paste (not low-sodium or reduced-sodium). Regular tomato and vegetable juice (not low-sodium or reduced-sodium). Rosita Fire. Olives. Fruits Canned fruit in a light or heavy syrup. Fried fruit. Fruit in cream or butter sauce. Meat and other protein foods Fatty cuts of meat. Ribs. Fried meat. Tomasa Blase. Sausage. Bologna and other processed lunch meats. Salami. Fatback. Hotdogs. Bratwurst. Salted nuts and seeds. Canned beans with added salt. Canned or smoked fish. Whole eggs or egg yolks. Chicken or [place] with skin. Dairy Whole or 2% milk, cream, and half-and-half. Whole or full-fat cream cheese. Whole-fat or sweetened yogurt. Full-fat cheese. Nondairy creamers. Whipped toppings. Processed cheese and cheese spreads. Fats and oils Butter. Stick margarine. Lard. Shortening. Ghee. Bacon fat. Tropical oils, such as coconut, palm kernel, or palm oil. Seasoning and other foods Salted popcorn and pretzels. Onion salt, garlic salt, seasoned salt, table salt, and sea salt. Worcestershire sauce. Tartar sauce. Barbecue sauce. Teriyaki sauce. Soy sauce, including reduced-sodium. Steak sauce. Canned and packaged gravies. Fish sauce. Oyster sauce. Cocktail sauce. Horseradish that you find on the shelf. Ketchup. Mustard. Meat flavorings and tenderizers. Bouillon cubes. Hot sauce and Tabasco sauce. Premade or packaged marinades. Premade or packaged taco seasonings. Relishes. Regular salad dressings. Where to find more information:  National Heart, Lung, and Blood Institute:  PopSteam.is  American Heart Association: www.heart.org Summary  The DASH eating plan is a healthy eating plan that has been shown to reduce high blood pressure (hypertension). It may also reduce your risk for type 2 diabetes, heart disease, and stroke.  With the DASH eating plan, you should limit salt (sodium) intake to 2,300 mg a day. If you have hypertension, you may need to reduce your sodium intake to 1,500 mg a day.  When on the DASH eating plan, aim to eat more fresh fruits and vegetables, whole grains, lean proteins, low-fat dairy, and heart-healthy fats.  Work with your health care provider or diet and nutrition specialist (dietitian) to adjust your eating plan to your individual calorie needs. This information is not intended to replace advice given to you by your health care provider. Make sure you discuss any questions you have with your health care provider. Document Revised: 05/19/2017 Document Reviewed: 05/30/2016 Elsevier Patient Education  2020 [employer] Inc.  High Cholesterol  High cholesterol is a condition in which the blood has high levels of a white, waxy, fat-like substance (cholesterol). The human body needs small amounts of cholesterol. The liver makes all the cholesterol that the body needs. Extra (excess) cholesterol comes from the food that we eat. Cholesterol is carried from the liver by the blood through the blood vessels. If you have high cholesterol, deposits (plaques) may build up on the walls of your blood vessels (arteries). Plaques make the arteries narrower and stiffer. Cholesterol plaques increase your risk for heart attack and stroke. Work with your health care provider to keep your cholesterol levels in a healthy range. What increases the risk? This condition is more likely to develop in people who:  Eat foods that are high in animal fat (saturated fat) or cholesterol.  Are overweight.  Are not getting enough exercise.  Have  a family history  of high cholesterol. What are the signs or symptoms? There are no symptoms of this condition. How is this diagnosed? This condition may be diagnosed from the results of a blood test.  If you are older than age 3, your health care provider may check your cholesterol every 4-6 years.  You may be checked more often if you already have high cholesterol or other risk factors for heart disease. The blood test for cholesterol measures:  "Bad" cholesterol (LDL cholesterol). This is the main type of cholesterol that causes heart disease. The desired level for LDL is less than 100.  "Good" cholesterol (HDL cholesterol). This type helps to protect against heart disease by cleaning the arteries and carrying the LDL away. The desired level for HDL is 60 or higher.  Triglycerides. These are fats that the body can store or burn for energy. The desired number for triglycerides is lower than 150.  Total cholesterol. This is a measure of the total amount of cholesterol in your blood, including LDL cholesterol, HDL cholesterol, and triglycerides. A healthy number is less than 200. How is this treated? This condition is treated with diet changes, lifestyle changes, and medicines. Diet changes  This may include eating more whole grains, fruits, vegetables, nuts, and fish.  This may also include cutting back on red meat and foods that have a lot of added sugar. Lifestyle changes  Changes may include getting at least 40 minutes of aerobic exercise 3 times a week. Aerobic exercises include walking, biking, and swimming. Aerobic exercise along with a healthy diet can help you maintain a healthy weight.  Changes may also include quitting smoking. Medicines  Medicines are usually given if diet and lifestyle changes have failed to reduce your cholesterol to healthy levels.  Your health care provider may prescribe a statin medicine. Statin medicines have been shown to reduce cholesterol, which can reduce the  risk of heart disease. Follow these instructions at home: Eating and drinking If told by your health care provider:  Eat chicken (without skin), fish, veal, shellfish, ground [place] breast, and round or loin cuts of red meat.  Do not eat fried foods or fatty meats, such as hot dogs and salami.  Eat plenty of fruits, such as apples.  Eat plenty of vegetables, such as broccoli, potatoes, and carrots.  Eat beans, peas, and lentils.  Eat grains such as barley, rice, couscous, and bulgur wheat.  Eat pasta without cream sauces.  Use skim or nonfat milk, and eat low-fat or nonfat yogurt and cheeses.  Do not eat or drink whole milk, cream, ice cream, egg yolks, or hard cheeses.  Do not eat stick margarine or tub margarines that contain trans fats (also called partially hydrogenated oils).  Do not eat saturated tropical oils, such as coconut oil and palm oil.  Do not eat cakes, cookies, crackers, or other baked goods that contain trans fats.  General instructions  Exercise as directed by your health care provider. Increase your activity level with activities such as gardening, walking, and taking the stairs.  Take over-the-counter and prescription medicines only as told by your health care provider.  Do not use any products that contain nicotine or tobacco, such as cigarettes and e-cigarettes. If you need help quitting, ask your health care provider.  Keep all follow-up visits as told by your health care provider. This is important. Contact a health care provider if:  You are struggling to maintain a healthy diet or weight.  You  need help to start on an exercise program.  You need help to stop smoking. Get help right away if:  You have chest pain.  You have trouble breathing. This information is not intended to replace advice given to you by your health care provider. Make sure you discuss any questions you have with your health care provider. Document Revised: 06/09/2017  Document Reviewed: 12/05/2015 Elsevier Patient Education  2020 [employer].  Cholesterol Content in Foods Cholesterol is a waxy, fat-like substance that helps to carry fat in the blood. The body needs cholesterol in small amounts, but too much cholesterol can cause damage to the arteries and heart. Most people should eat less than 200 milligrams (mg) of cholesterol a day. Foods with cholesterol  Cholesterol is found in animal-based foods, such as meat, seafood, and dairy. Generally, low-fat dairy and lean meats have less cholesterol than full-fat dairy and fatty meats. The milligrams of cholesterol per serving (mg per serving) of common cholesterol-containing foods are listed below. Meat and other proteins  Egg -- one large whole egg has 186 mg.  Veal shank -- 4 oz has 141 mg.  Lean ground [place] (93% lean) -- 4 oz has 118 mg.  Fat-trimmed lamb loin -- 4 oz has 106 mg.  Lean ground beef (90% lean) -- 4 oz has 100 mg.  Lobster -- 3.5 oz has 90 mg.  Pork loin chops -- 4 oz has 86 mg.  Canned salmon -- 3.5 oz has 83 mg.  Fat-trimmed beef top loin -- 4 oz has 78 mg.  Frankfurter -- 1 frank (3.5 oz) has 77 mg.  Crab -- 3.5 oz has 71 mg.  Roasted chicken without skin, white meat -- 4 oz has 66 mg.  Light bologna -- 2 oz has 45 mg.  Deli-cut [place] -- 2 oz has 31 mg.  Canned tuna -- 3.5 oz has 31 mg.  Tomasa Blase -- 1 oz has 29 mg.  Oysters and mussels (raw) -- 3.5 oz has 25 mg.  Mackerel -- 1 oz has 22 mg.  Trout -- 1 oz has 20 mg.  Pork sausage -- 1 link (1 oz) has 17 mg.  Salmon -- 1 oz has 16 mg.  Tilapia -- 1 oz has 14 mg. Dairy  Soft-serve ice cream --  cup (4 oz) has 103 mg.  Whole-milk yogurt -- 1 cup (8 oz) has 29 mg.  Cheddar cheese -- 1 oz has 28 mg.  American cheese -- 1 oz has 28 mg.  Whole milk -- 1 cup (8 oz) has 23 mg.  2% milk -- 1 cup (8 oz) has 18 mg.  Cream cheese -- 1 tablespoon (Tbsp) has 15 mg.  Cottage cheese --  cup (4 oz) has 14  mg.  Low-fat (1%) milk -- 1 cup (8 oz) has 10 mg.  Sour cream -- 1 Tbsp has 8.5 mg.  Low-fat yogurt -- 1 cup (8 oz) has 8 mg.  Nonfat Greek yogurt -- 1 cup (8 oz) has 7 mg.  Half-and-half cream -- 1 Tbsp has 5 mg. Fats and oils  Cod liver oil -- 1 tablespoon (Tbsp) has 82 mg.  Butter -- 1 Tbsp has 15 mg.  Lard -- 1 Tbsp has 14 mg.  Bacon grease -- 1 Tbsp has 14 mg.  Mayonnaise -- 1 Tbsp has 5-10 mg.  Margarine -- 1 Tbsp has 3-10 mg. Exact amounts of cholesterol in these foods may vary depending on specific ingredients and brands. Foods without cholesterol Most plant-based  foods do not have cholesterol unless you combine them with a food that has cholesterol. Foods without cholesterol include:  Grains and cereals.  Vegetables.  Fruits.  Vegetable oils, such as olive, canola, and sunflower oil.  Legumes, such as peas, beans, and lentils.  Nuts and seeds.  Egg whites. Summary  The body needs cholesterol in small amounts, but too much cholesterol can cause damage to the arteries and heart.  Most people should eat less than 200 milligrams (mg) of cholesterol a day. This information is not intended to replace advice given to you by your health care provider. Make sure you discuss any questions you have with your health care provider. Document Revised: 05/19/2017 Document Reviewed: 01/31/2017 Elsevier Patient Education  2020 Elsevier Inc.  Fatty Liver Disease  Fatty liver disease occurs when too much fat has built up in your liver cells. Fatty liver disease is also called hepatic steatosis or steatohepatitis. The liver removes harmful substances from your bloodstream and produces fluids that your body needs. It also helps your body use and store energy from the food you eat. In many cases, fatty liver disease does not cause symptoms or problems. It is often diagnosed when tests are being done for other reasons. However, over time, fatty liver can cause inflammation that  may lead to more serious liver problems, such as scarring of the liver (cirrhosis) and liver failure. Fatty liver is associated with insulin resistance, increased body fat, high blood pressure (hypertension), and high cholesterol. These are features of metabolic syndrome and increase your risk for stroke, diabetes, and heart disease. What are the causes? This condition may be caused by:  Drinking too much alcohol.  Poor nutrition.  Obesity.  Cushing's syndrome.  Diabetes.  High cholesterol.  Certain drugs.  Poisons.  Some viral infections.  Pregnancy. What increases the risk? You are more likely to develop this condition if you:  Abuse alcohol.  Are overweight.  Have diabetes.  Have hepatitis.  Have a high triglyceride level.  Are pregnant. What are the signs or symptoms? Fatty liver disease often does not cause symptoms. If symptoms do develop, they can include:  Fatigue.  Weakness.  Weight loss.  Confusion.  Abdominal pain.  Nausea and vomiting.  Yellowing of your skin and the white parts of your eyes (jaundice).  Itchy skin. How is this diagnosed? This condition may be diagnosed by:  A physical exam and medical history.  Blood tests.  Imaging tests, such as an ultrasound, CT scan, or MRI.  A liver biopsy. A small sample of liver tissue is removed using a needle. The sample is then looked at under a microscope. How is this treated? Fatty liver disease is often caused by other health conditions. Treatment for fatty liver may involve medicines and lifestyle changes to manage conditions such as:  Alcoholism.  High cholesterol.  Diabetes.  Being overweight or obese. Follow these instructions at home:   Do not drink alcohol. If you have trouble quitting, ask your health care provider how to safely quit with the help of medicine or a supervised program. This is important to keep your condition from getting worse.  Eat a healthy diet as told  by your health care provider. Ask your health care provider about working with a diet and nutrition specialist (dietitian) to develop an eating plan.  Exercise regularly. This can help you lose weight and control your cholesterol and diabetes. Talk to your health care provider about an exercise plan and which activities are  best for you.  Take over-the-counter and prescription medicines only as told by your health care provider.  Keep all follow-up visits as told by your health care provider. This is important. Contact a health care provider if: You have trouble controlling your:  Blood sugar. This is especially important if you have diabetes.  Cholesterol.  Drinking of alcohol. Get help right away if:  You have abdominal pain.  You have jaundice.  You have nausea and vomiting.  You vomit blood or material that looks like coffee grounds.  You have stools that are black, tar-like, or bloody. Summary  Fatty liver disease develops when too much fat builds up in the cells of your liver.  Fatty liver disease often causes no symptoms or problems. However, over time, fatty liver can cause inflammation that may lead to more serious liver problems, such as scarring of the liver (cirrhosis).  You are more likely to develop this condition if you abuse alcohol, are pregnant, are overweight, have diabetes, have hepatitis, or have high triglyceride levels.  Contact your health care provider if you have trouble controlling your weight, blood sugar, cholesterol, or drinking of alcohol. This information is not intended to replace advice given to you by your health care provider. Make sure you discuss any questions you have with your health care provider. Document Revised: 05/19/2017 Document Reviewed: 03/15/2017 Elsevier Patient Education  2020 [employer].

## 2020-04-10 NOTE — Progress Notes (Signed)
Chief Complaint  Patient presents with  . Follow-up  . Medication Management   Annual  1. C/o right low back pain 8/10 at times seen chiropractor in [facility] in the past and still pain flares worse in the am with movement better He may have had Xrays with chiropractor but no record Agreeable to try flexeril and stretching as we disc MRI consider in the future let me know  2. Oral blister right upper lip wants acyclovir refill and wants to try valtrex  3. covid 01/06/20 + lost smell for 2 days after having vaccine  4. HTN on losartan 100, hctz 25 will simplify in 1 pill for pt again prior was on back order BP at times 195/176 but he checked at pharmacy though I encouraged him to get his own BP cuff  5. Obesity wt is down 10 lbs was up to 251 goal 200s trying to eat healthy and exercise though having right lower back pain at times    Review of Systems  Constitutional: Positive for weight loss.  HENT: Negative for hearing loss.   Eyes: Negative for blurred vision.  Respiratory: Negative for shortness of breath.   Cardiovascular: Negative for chest pain.  Gastrointestinal: Negative for abdominal pain.  Musculoskeletal: Positive for back pain.  Skin: Negative for rash.  Neurological: Negative for headaches.  Psychiatric/Behavioral: Negative for depression.   Past Medical History:  Diagnosis Date  . Allergic rhinitis due to pollen   . COVID-19    01/06/20 +   . Fatty liver   . HTN (hypertension)   . Hyperlipidemia   . ITP (idiopathic thrombocytopenic purpura)    off prednisone since 02/2010, ?etiology per pt 2/2 antibiotics    Past Surgical History:  Procedure Laterality Date  . NASAL SINUS SURGERY     Family History  Problem Relation Age of Onset  . Arthritis Other    Social History   Socioeconomic History  . Marital status: Legally Separated    Spouse name: Not on file  . Number of children: 3  . Years of education: Not on file  . Highest education level: Not on file    Occupational History  . Occupation: Transport planner, retired as of 2019    Employer: [employer]  Tobacco Use  . Smoking status: Never Smoker  . Smokeless tobacco: Never Used  Substance and Sexual Activity  . Alcohol use: Yes    Comment: occ wine  . Drug use: No  . Sexual activity: Not on file  Other Topics Concern  . Not on file  Social History Narrative   Married with kids divorced as of 03/2020, 30+ years marriage    Retired Chartered loss adjuster works [place]   3 kids as of 03/2020 2 girls and 1 boy ages 37, 20, 65    Social Determinants of Health   Physicist, medical Strain:   . Difficulty of Paying Living Expenses: Not on file  Food Insecurity:   . Worried About Programme researcher, broadcasting/film/video in the Last Year: Not on file  . Ran Out of Food in the Last Year: Not on file  Transportation Needs:   . Lack of Transportation (Medical): Not on file  . Lack of Transportation (Non-Medical): Not on file  Physical Activity:   . Days of Exercise per Week: Not on file  . Minutes of Exercise per Session: Not on file  Stress:   . Feeling of Stress : Not on file  Social Connections:   . Frequency of Communication  with Friends and Family: Not on file  . Frequency of Social Gatherings with Friends and Family: Not on file  . Attends Religious Services: Not on file  . Active Member of Clubs or Organizations: Not on file  . Attends Banker Meetings: Not on file  . Marital Status: Not on file  Intimate Partner Violence:   . Fear of Current or Ex-Partner: Not on file  . Emotionally Abused: Not on file  . Physically Abused: Not on file  . Sexually Abused: Not on file   Current Meds  Medication Sig  . fluticasone (FLONASE) 50 MCG/ACT nasal spray PLACE 2 SPRAYS INTO BOTH NOSTRILS DAILY.  Marland Kitchen losartan-hydrochlorothiazide (HYZAAR) 100-25 MG tablet Take 1 tablet by mouth daily. In am  . [DISCONTINUED] hydrochlorothiazide (HYDRODIURIL) 25 MG tablet TAKE 1 TABLET BY MOUTH DAILY NEEDS APPT  .  [DISCONTINUED] losartan (COZAAR) 100 MG tablet Take 1 tablet (100 mg total) by mouth daily. appt further refills by 09/2018  . [DISCONTINUED] losartan-hydrochlorothiazide (HYZAAR) 100-25 MG tablet TAKE 1 TABLET BY MOUTH EVERY DAY   Allergies  Allergen Reactions  . Hydrocodone-Chlorpheniramine Itching   No results found for this or any previous visit (from the past 2160 hour(s)). Objective  Body mass index is 33.19 kg/m. Wt Readings from Last 3 Encounters:  04/10/20 238 lb (108 kg)  10/11/17 237 lb 3.2 oz (107.6 kg)  09/28/17 242 lb 11.2 oz (110.1 kg)   Temp Readings from Last 3 Encounters:  04/10/20 98.4 F (36.9 C) (Oral)  10/11/17 98.1 F (36.7 C) (Oral)  09/28/17 98.3 F (36.8 C) (Oral)   BP Readings from Last 3 Encounters:  04/10/20 120/76  10/11/17 140/86  09/28/17 (!) 154/96   Pulse Readings from Last 3 Encounters:  04/10/20 76  10/11/17 68  09/28/17 77    Physical Exam Vitals and nursing note reviewed.  Constitutional:      Appearance: Normal appearance. He is well-developed and well-groomed. He is obese.  HENT:     Head: Normocephalic and atraumatic.  Eyes:     Conjunctiva/sclera: Conjunctivae normal.     Pupils: Pupils are equal, round, and reactive to light.  Cardiovascular:     Rate and Rhythm: Normal rate and regular rhythm.     Heart sounds: Normal heart sounds. No murmur heard.   Pulmonary:     Effort: Pulmonary effort is normal.     Breath sounds: Normal breath sounds.  Abdominal:     General: Abdomen is flat. Bowel sounds are normal.     Tenderness: There is no abdominal tenderness.  Musculoskeletal:       Back:  Skin:    General: Skin is warm and dry.  Neurological:     General: No focal deficit present.     Mental Status: He is alert and oriented to person, place, and time. Mental status is at baseline.     Gait: Gait normal.  Psychiatric:        Attention and Perception: Attention and perception normal.        Mood and Affect: Mood  and affect normal.        Speech: Speech normal.        Behavior: Behavior normal. Behavior is cooperative.        Thought Content: Thought content normal.        Cognition and Memory: Cognition and memory normal.        Judgment: Judgment normal.     Assessment  Plan  Annual physical exam -  sch fasting labs  Declines flu shot  UTD Tdap  Pfizer 2/2 had covid 7/19/21considering booster disc'ed today rec hep A/B vaccine prev h/o fatty liver  psa pending colonoscopy consider in future will let me know when ready for referral Eye exam 03/2020 per pt  Consider dermatology in future if not seen    Essential hypertension controlled today - Plan: losartan-hydrochlorothiazide (HYZAAR) 100-25 MG tablet, Comprehensive metabolic panel, Lipid panel, CBC with Differential/Platelet Consider norvasc 2.5 mg qd if elevating in future >130/>80   Oral herpes - Plan: acyclovir ointment (ZOVIRAX) 5 % Valtrex prn   Mid back pain Acute right-sided low back pain without sciatica Consider Xray mid back and MRI low back  Consider getting copies Xrays chiropractor in [place] Flexeril qhs prn    Fatty liver - Plan: Comprehensive metabolic panel, Hepatitis A Ab, Total, Hepatitis C antibody Not immune hep B  History of COVID-19 01/06/20 s/p 2/2 covid shots  - Plan: SARS-CoV-2 Semi-Quantitative Total Antibody, Spike   Provider: Dr. French Ana McLean-Scocuzza-Internal Medicine

## 2020-04-13 ENCOUNTER — Encounter: Payer: Self-pay | Admitting: Internal Medicine

## 2020-04-13 ENCOUNTER — Other Ambulatory Visit: Payer: Self-pay

## 2020-04-13 ENCOUNTER — Other Ambulatory Visit: Payer: Self-pay | Admitting: Internal Medicine

## 2020-04-13 ENCOUNTER — Other Ambulatory Visit (INDEPENDENT_AMBULATORY_CARE_PROVIDER_SITE_OTHER): Payer: BC Managed Care – PPO

## 2020-04-13 DIAGNOSIS — Z13818 Encounter for screening for other digestive system disorders: Secondary | ICD-10-CM | POA: Diagnosis not present

## 2020-04-13 DIAGNOSIS — Z1329 Encounter for screening for other suspected endocrine disorder: Secondary | ICD-10-CM | POA: Diagnosis not present

## 2020-04-13 DIAGNOSIS — E559 Vitamin D deficiency, unspecified: Secondary | ICD-10-CM

## 2020-04-13 DIAGNOSIS — Z0000 Encounter for general adult medical examination without abnormal findings: Secondary | ICD-10-CM | POA: Diagnosis not present

## 2020-04-13 DIAGNOSIS — I10 Essential (primary) hypertension: Secondary | ICD-10-CM

## 2020-04-13 DIAGNOSIS — Z0184 Encounter for antibody response examination: Secondary | ICD-10-CM

## 2020-04-13 DIAGNOSIS — Z862 Personal history of diseases of the blood and blood-forming organs and certain disorders involving the immune mechanism: Secondary | ICD-10-CM | POA: Insufficient documentation

## 2020-04-13 DIAGNOSIS — Z8616 Personal history of COVID-19: Secondary | ICD-10-CM | POA: Diagnosis not present

## 2020-04-13 DIAGNOSIS — K760 Fatty (change of) liver, not elsewhere classified: Secondary | ICD-10-CM

## 2020-04-13 DIAGNOSIS — R739 Hyperglycemia, unspecified: Secondary | ICD-10-CM

## 2020-04-13 DIAGNOSIS — Z125 Encounter for screening for malignant neoplasm of prostate: Secondary | ICD-10-CM | POA: Diagnosis not present

## 2020-04-13 DIAGNOSIS — Z1389 Encounter for screening for other disorder: Secondary | ICD-10-CM

## 2020-04-13 DIAGNOSIS — D696 Thrombocytopenia, unspecified: Secondary | ICD-10-CM

## 2020-04-13 DIAGNOSIS — R7303 Prediabetes: Secondary | ICD-10-CM | POA: Insufficient documentation

## 2020-04-13 LAB — LIPID PANEL
Cholesterol: 170 mg/dL (ref 0–200)
HDL: 38.9 mg/dL — ABNORMAL LOW (ref >39.00)
LDL Cholesterol: 103 mg/dL — ABNORMAL HIGH (ref 0–99)
NonHDL: 131.59
Total CHOL/HDL Ratio: 4
Triglycerides: 142 mg/dL (ref 0.0–149.0)
VLDL: 28.4 mg/dL (ref 0.0–40.0)

## 2020-04-13 LAB — CBC WITH DIFFERENTIAL/PLATELET
Basophils Absolute: 0 10*3/uL (ref 0.0–0.1)
Basophils Relative: 0.9 % (ref 0.0–3.0)
Eosinophils Absolute: 0.2 10*3/uL (ref 0.0–0.7)
Eosinophils Relative: 3.9 % (ref 0.0–5.0)
HCT: 43.7 % (ref 39.0–52.0)
Hemoglobin: 15 g/dL (ref 13.0–17.0)
Lymphocytes Relative: 28.5 % (ref 12.0–46.0)
Lymphs Abs: 1.2 10*3/uL (ref 0.7–4.0)
MCHC: 34.3 g/dL (ref 30.0–36.0)
MCV: 90.9 fl (ref 78.0–100.0)
Monocytes Absolute: 0.4 10*3/uL (ref 0.1–1.0)
Monocytes Relative: 9.3 % (ref 3.0–12.0)
Neutro Abs: 2.5 10*3/uL (ref 1.4–7.7)
Neutrophils Relative %: 57.4 % (ref 43.0–77.0)
Platelets: 146 10*3/uL — ABNORMAL LOW (ref 150.0–400.0)
RBC: 4.81 Mil/uL (ref 4.22–5.81)
RDW: 13.4 % (ref 11.5–15.5)
WBC: 4.3 10*3/uL (ref 4.0–10.5)

## 2020-04-13 LAB — HEMOGLOBIN A1C: Hgb A1c MFr Bld: 5.7 % (ref 4.6–6.5)

## 2020-04-13 LAB — COMPREHENSIVE METABOLIC PANEL
ALT: 50 U/L (ref 0–53)
AST: 20 U/L (ref 0–37)
Albumin: 4.4 g/dL (ref 3.5–5.2)
Alkaline Phosphatase: 52 U/L (ref 39–117)
BUN: 19 mg/dL (ref 6–23)
CO2: 30 mEq/L (ref 19–32)
Calcium: 9.3 mg/dL (ref 8.4–10.5)
Chloride: 101 mEq/L (ref 96–112)
Creatinine, Ser: 1.06 mg/dL (ref 0.40–1.50)
GFR: 77.59 mL/min (ref >60.00)
Glucose, Bld: 98 mg/dL (ref 70–99)
Potassium: 3.7 mEq/L (ref 3.5–5.1)
Sodium: 139 mEq/L (ref 135–145)
Total Bilirubin: 1 mg/dL (ref 0.2–1.2)
Total Protein: 6.3 g/dL (ref 6.0–8.3)

## 2020-04-13 LAB — TSH: TSH: 2.16 u[IU]/mL (ref 0.35–4.50)

## 2020-04-13 LAB — PSA: PSA: 0.71 ng/mL (ref 0.10–4.00)

## 2020-04-13 LAB — VITAMIN D 25 HYDROXY (VIT D DEFICIENCY, FRACTURES): VITD: 25.36 ng/mL — ABNORMAL LOW (ref 30.00–100.00)

## 2020-04-14 LAB — URINALYSIS, ROUTINE W REFLEX MICROSCOPIC
Bilirubin Urine: NEGATIVE
Glucose, UA: NEGATIVE
Hgb urine dipstick: NEGATIVE
Ketones, ur: NEGATIVE
Leukocytes,Ua: NEGATIVE
Nitrite: NEGATIVE
Protein, ur: NEGATIVE
Specific Gravity, Urine: 1.007 (ref 1.001–1.03)
pH: 7.5 (ref 5.0–8.0)

## 2020-04-14 LAB — HEPATITIS A ANTIBODY, TOTAL: Hepatitis A AB,Total: NONREACTIVE

## 2020-04-14 LAB — HEPATITIS C ANTIBODY
Hepatitis C Ab: NONREACTIVE
SIGNAL TO CUT-OFF: 0 (ref <1.00)

## 2020-04-16 ENCOUNTER — Encounter: Payer: Self-pay | Admitting: Internal Medicine

## 2020-04-16 DIAGNOSIS — U071 COVID-19: Secondary | ICD-10-CM | POA: Insufficient documentation

## 2020-04-16 LAB — SARS-COV-2 SEMI-QUANTITATIVE TOTAL ANTIBODY, SPIKE: SARS COV2 AB, Total Spike Semi QN: >2500 U/mL — ABNORMAL HIGH (ref <0.8)

## 2020-10-09 ENCOUNTER — Ambulatory Visit: Payer: BC Managed Care – PPO | Admitting: Internal Medicine

## 2020-11-04 ENCOUNTER — Other Ambulatory Visit: Payer: Self-pay | Admitting: Internal Medicine

## 2020-11-04 ENCOUNTER — Encounter: Payer: Self-pay | Admitting: Internal Medicine

## 2020-11-04 ENCOUNTER — Telehealth: Payer: Self-pay | Admitting: Internal Medicine

## 2020-11-04 ENCOUNTER — Other Ambulatory Visit: Payer: Self-pay

## 2020-11-04 ENCOUNTER — Ambulatory Visit (INDEPENDENT_AMBULATORY_CARE_PROVIDER_SITE_OTHER): Payer: BC Managed Care – PPO | Admitting: Internal Medicine

## 2020-11-04 VITALS — BP 124/76 | HR 84 | Temp 98.1°F | Ht 71.0 in | Wt 234.6 lb

## 2020-11-04 DIAGNOSIS — E669 Obesity, unspecified: Secondary | ICD-10-CM | POA: Diagnosis not present

## 2020-11-04 DIAGNOSIS — E559 Vitamin D deficiency, unspecified: Secondary | ICD-10-CM

## 2020-11-04 DIAGNOSIS — L821 Other seborrheic keratosis: Secondary | ICD-10-CM

## 2020-11-04 DIAGNOSIS — Z1211 Encounter for screening for malignant neoplasm of colon: Secondary | ICD-10-CM

## 2020-11-04 DIAGNOSIS — L918 Other hypertrophic disorders of the skin: Secondary | ICD-10-CM

## 2020-11-04 DIAGNOSIS — D696 Thrombocytopenia, unspecified: Secondary | ICD-10-CM

## 2020-11-04 DIAGNOSIS — B002 Herpesviral gingivostomatitis and pharyngotonsillitis: Secondary | ICD-10-CM

## 2020-11-04 DIAGNOSIS — E785 Hyperlipidemia, unspecified: Secondary | ICD-10-CM

## 2020-11-04 DIAGNOSIS — Z1283 Encounter for screening for malignant neoplasm of skin: Secondary | ICD-10-CM

## 2020-11-04 DIAGNOSIS — K760 Fatty (change of) liver, not elsewhere classified: Secondary | ICD-10-CM

## 2020-11-04 DIAGNOSIS — J309 Allergic rhinitis, unspecified: Secondary | ICD-10-CM

## 2020-11-04 DIAGNOSIS — R7303 Prediabetes: Secondary | ICD-10-CM

## 2020-11-04 DIAGNOSIS — I10 Essential (primary) hypertension: Secondary | ICD-10-CM

## 2020-11-04 DIAGNOSIS — G4700 Insomnia, unspecified: Secondary | ICD-10-CM

## 2020-11-04 MED ORDER — FLUTICASONE PROPIONATE 50 MCG/ACT NA SUSP: NASAL | 11 refills | Status: DC

## 2020-11-04 MED ORDER — ACYCLOVIR 5 % EX OINT: 1.0000 "application " | TOPICAL_OINTMENT | Freq: Four times a day (QID) | CUTANEOUS | 11 refills | Status: DC | PRN

## 2020-11-04 MED ORDER — LOSARTAN POTASSIUM-HCTZ 100-25 MG PO TABS: 1.0000 | ORAL_TABLET | Freq: Every day | ORAL | 3 refills | Status: DC

## 2020-11-04 MED ORDER — ZOLPIDEM TARTRATE 5 MG PO TABS: 2.5000 mg | ORAL_TABLET | Freq: Every evening | ORAL | 2 refills | Status: DC | PRN

## 2020-11-04 NOTE — Telephone Encounter (Signed)
Can he use good Rx coupon or see if he can figure out what pharmacy will cover Denavir?  Let me know

## 2020-11-04 NOTE — Progress Notes (Signed)
Chief Complaint  Patient presents with  . Follow-up   Fu 1. htn controled on hyzaar 100-25 mg qd  2. hsv wants zovirax not valtrex helps better  3. Insomnia trouble staying asleep was on ambien 2.5 in the past and helped a lot of life changes dad moving in with him mom died 1.5 years ago divorced work stress and he needs to sleep   Review of Systems  Constitutional: Negative for weight loss.  HENT: Negative for hearing loss.   Eyes: Negative for blurred vision.  Respiratory: Negative for shortness of breath.   Cardiovascular: Negative for chest pain.  Gastrointestinal: Negative for abdominal pain.  Musculoskeletal: Negative for falls and joint pain.  Skin: Negative for rash.  Neurological: Negative for headaches.  Psychiatric/Behavioral: The patient is nervous/anxious and has insomnia.    Past Medical History:  Diagnosis Date  . Allergic rhinitis due to pollen   . COVID-19    01/06/20 +   . Fatty liver   . HTN (hypertension)   . Hyperlipidemia   . ITP (idiopathic thrombocytopenic purpura)    off prednisone since 02/2010, ?etiology per pt 2/2 antibiotics    Past Surgical History:  Procedure Laterality Date  . NASAL SINUS SURGERY     Family History  Problem Relation Age of Onset  . Arthritis Other   . Hypertension Father    Social History   Socioeconomic History  . Marital status: Legally Separated    Spouse name: Not on file  . Number of children: 3  . Years of education: Not on file  . Highest education level: Not on file  Occupational History  . Occupation: Transport planner, retired as of 2019    Employer: [employer]  Tobacco Use  . Smoking status: Never Smoker  . Smokeless tobacco: Never Used  Substance and Sexual Activity  . Alcohol use: Yes    Comment: occ wine  . Drug use: No  . Sexual activity: Not on file  Other Topics Concern  . Not on file  Social History Narrative   Married with kids divorced as of 03/2020, 30+ years marriage    Retired  Chartered loss adjuster works [place]   3 kids as of 03/2020 2 girls and 1 boy ages 60, 48, 68    Dad lives with him    Social Determinants of Corporate investment banker Strain: Not on file  Food Insecurity: Not on file  Transportation Needs: Not on file  Physical Activity: Not on file  Stress: Not on file  Social Connections: Not on file  Intimate Partner Violence: Not on file   Current Meds  Medication Sig  . zolpidem (AMBIEN) 5 MG tablet Take 0.5 tablets (2.5 mg total) by mouth at bedtime as needed for sleep.  . [DISCONTINUED] fluticasone (FLONASE) 50 MCG/ACT nasal spray PLACE 2 SPRAYS INTO BOTH NOSTRILS DAILY.  . [DISCONTINUED] losartan-hydrochlorothiazide (HYZAAR) 100-25 MG tablet Take 1 tablet by mouth daily. In am  . [DISCONTINUED] valACYclovir (VALTREX) 1000 MG tablet Take 2 tablets (2,000 mg total) by mouth 2 (two) times daily. Prn x fever blister onset x 1 day and stop   Allergies  Allergen Reactions  . Hydrocodone-Chlorpheniramine Itching   No results found for this or any previous visit (from the past 2160 hour(s)). Objective  Body mass index is 32.72 kg/m. Wt Readings from Last 3 Encounters:  11/04/20 234 lb 9.6 oz (106.4 kg)  04/10/20 238 lb (108 kg)  10/11/17 237 lb 3.2 oz (107.6 kg)  Temp Readings from Last 3 Encounters:  11/04/20 98.1 F (36.7 C) (Oral)  04/10/20 98.4 F (36.9 C) (Oral)  10/11/17 98.1 F (36.7 C) (Oral)   BP Readings from Last 3 Encounters:  11/04/20 124/76  04/10/20 120/76  10/11/17 140/86   Pulse Readings from Last 3 Encounters:  11/04/20 84  04/10/20 76  10/11/17 68    Physical Exam Vitals and nursing note reviewed.  Constitutional:      Appearance: Normal appearance. He is well-developed and well-groomed. He is obese.  HENT:     Head: Normocephalic and atraumatic.  Eyes:     Conjunctiva/sclera: Conjunctivae normal.     Pupils: Pupils are equal, round, and reactive to light.  Cardiovascular:     Rate and Rhythm: Normal rate  and regular rhythm.     Heart sounds: Normal heart sounds. No murmur heard.   Pulmonary:     Effort: Pulmonary effort is normal.     Breath sounds: Normal breath sounds.  Abdominal:     General: Abdomen is flat. Bowel sounds are normal.  Skin:    General: Skin is warm and dry.  Neurological:     General: No focal deficit present.     Mental Status: He is alert and oriented to person, place, and time. Mental status is at baseline.     Gait: Gait normal.  Psychiatric:        Attention and Perception: Attention and perception normal.        Mood and Affect: Mood and affect normal.        Speech: Speech normal.        Behavior: Behavior normal. Behavior is cooperative.        Thought Content: Thought content normal.        Cognition and Memory: Cognition and memory normal.        Judgment: Judgment normal.     Assessment  Plan  Essential hypertension controlled - Plan: losartan-hydrochlorothiazide (HYZAAR) 100-25 MG tablet, Comprehensive metabolic panel, Lipid panel, CBC with Differential/Platelet  Hyperlipidemia, unspecified hyperlipidemia type Obesity (BMI 30-39.9) Prediabetes - Plan: Hemoglobin A1c rec healthy diet and exercise   Oral herpes - Plan: acyclovir ointment (ZOVIRAX) 5 %  Allergic rhinitis, unspecified seasonality, unspecified trigger - Plan: fluticasone (FLONASE) 50 MCG/ACT nasal spray   Fatty liver - Plan: Ambulatory referral to Gastroenterology Consider hep A/B vaccine  Insomnia, unspecified type - Plan: zolpidem (AMBIEN) 2.5-5 MG tablet  Vitamin D deficiency - Plan: Vitamin D (25 hydroxy)  Thrombocytopenia (HCC) with h/o ITP- Plan: CBC with Differential/Platelet   HM-CPE  sch fasting labs  Declines flu shot [employer] 2/2 had covid 7/19/21considering booster disc'ed today rec hep A/B vaccine prev h/o fatty liver Hep C negative  psa 0.71 normal 04/13/20 colonoscopy referred  Eye exam 03/2020 per pt  Referred [place] dermat[facility]     Provider: Dr. French Ana McLean-Scocuzza-Internal Medicine

## 2020-11-04 NOTE — Patient Instructions (Addendum)
L lysine nature made supplement for cold sore prevention  Consider pfizer 3rd and 4th doses it may wear off after 5-6 months   Insomnia Insomnia is a sleep disorder that makes it difficult to fall asleep or stay asleep. Insomnia can cause fatigue, low energy, difficulty concentrating, mood swings, and poor performance at work or school. There are three different ways to classify insomnia:  Difficulty falling asleep.  Difficulty staying asleep.  Waking up too early in the morning. Any type of insomnia can be long-term (chronic) or short-term (acute). Both are common. Short-term insomnia usually lasts for three months or less. Chronic insomnia occurs at least three times a week for longer than three months. What are the causes? Insomnia may be caused by another condition, situation, or substance, such as:  Anxiety.  Certain medicines.  Gastroesophageal reflux disease (GERD) or other gastrointestinal conditions.  Asthma or other breathing conditions.  Restless legs syndrome, sleep apnea, or other sleep disorders.  Chronic pain.  Menopause.  Stroke.  Abuse of alcohol, tobacco, or illegal drugs.  Mental health conditions, such as depression.  Caffeine.  Neurological disorders, such as Alzheimer's disease.  An overactive thyroid (hyperthyroidism). Sometimes, the cause of insomnia may not be known. What increases the risk? Risk factors for insomnia include:  Gender. Women are affected more often than men.  Age. Insomnia is more common as you get older.  Stress.  Lack of exercise.  Irregular work schedule or working night shifts.  Traveling between different time zones.  Certain medical and mental health conditions. What are the signs or symptoms? If you have insomnia, the main symptom is having trouble falling asleep or having trouble staying asleep. This may lead to other symptoms, such as:  Feeling fatigued or having low energy.  Feeling nervous about going  to sleep.  Not feeling rested in the morning.  Having trouble concentrating.  Feeling irritable, anxious, or depressed. How is this diagnosed? This condition may be diagnosed based on:  Your symptoms and medical history. Your health care provider may ask about: ? Your sleep habits. ? Any medical conditions you have. ? Your mental health.  A physical exam. How is this treated? Treatment for insomnia depends on the cause. Treatment may focus on treating an underlying condition that is causing insomnia. Treatment may also include:  Medicines to help you sleep.  Counseling or therapy.  Lifestyle adjustments to help you sleep better. Follow these instructions at home: Eating and drinking  Limit or avoid alcohol, caffeinated beverages, and cigarettes, especially close to bedtime. These can disrupt your sleep.  Do not eat a large meal or eat spicy foods right before bedtime. This can lead to digestive discomfort that can make it hard for you to sleep.   Sleep habits  Keep a sleep diary to help you and your health care provider figure out what could be causing your insomnia. Write down: ? When you sleep. ? When you wake up during the night. ? How well you sleep. ? How rested you feel the next day. ? Any side effects of medicines you are taking. ? What you eat and drink.  Make your bedroom a dark, comfortable place where it is easy to fall asleep. ? Put up shades or blackout curtains to block light from outside. ? Use a white noise machine to block noise. ? Keep the temperature cool.  Limit screen use before bedtime. This includes: ? Watching TV. ? Using your smartphone, tablet, or computer.  Stick to a  routine that includes going to bed and waking up at the same times every day and night. This can help you fall asleep faster. Consider making a quiet activity, such as reading, part of your nighttime routine.  Try to avoid taking naps during the day so that you sleep better  at night.  Get out of bed if you are still awake after 15 minutes of trying to sleep. Keep the lights down, but try reading or doing a quiet activity. When you feel sleepy, go back to bed.   General instructions  Take over-the-counter and prescription medicines only as told by your health care provider.  Exercise regularly, as told by your health care provider. Avoid exercise starting several hours before bedtime.  Use relaxation techniques to manage stress. Ask your health care provider to suggest some techniques that may work well for you. These may include: ? Breathing exercises. ? Routines to release muscle tension. ? Visualizing peaceful scenes.  Make sure that you drive carefully. Avoid driving if you feel very sleepy.  Keep all follow-up visits as told by your health care provider. This is important. Contact a health care provider if:  You are tired throughout the day.  You have trouble in your daily routine due to sleepiness.  You continue to have sleep problems, or your sleep problems get worse. Get help right away if:  You have serious thoughts about hurting yourself or someone else. If you ever feel like you may hurt yourself or others, or have thoughts about taking your own life, get help right away. You can go to your nearest emergency department or call:  Your local emergency services (911 in the [place]  A suicide crisis helpline, such as the National Suicide Prevention Lifeline at (902) 047-5783. This is open 24 hours a day. Summary  Insomnia is a sleep disorder that makes it difficult to fall asleep or stay asleep.  Insomnia can be long-term (chronic) or short-term (acute).  Treatment for insomnia depends on the cause. Treatment may focus on treating an underlying condition that is causing insomnia.  Keep a sleep diary to help you and your health care provider figure out what could be causing your insomnia. This information is not intended to replace advice  given to you by your health care provider. Make sure you discuss any questions you have with your health care provider. Document Revised: 04/16/2020 Document Reviewed: 04/16/2020 Elsevier Patient Education  2021 Else[employer] Inc.  Cold Sore  A cold sore, also called a fever blister, is a small, fluid-filled sore that forms inside the mouth or on the lips, gums, nose, chin, or cheeks. Cold sores can spread to other parts of the body, such as the eyes or fingers. In some people who have other medical conditions, cold sores can spread to multiple other body sites, including the genitals. Cold sores can spread from person to person (are contagious) until the sores crust over completely. Most cold sores go away within 2 weeks. What are the causes? Cold sores are caused by an infection from a common type of herpes simplex virus (HSV-1). HSV-1 is closely related to the HSV-2virus, which is the virus that causes genital herpes, but these viruses are not the same. Once a person is infected with HSV-1, the virus remains permanently in the body. HSV-1 is spread from person to person through close contact, such as through kissing, touching the affected area, or sharing personal items such as lip balm, razors, a drinking glass, or eating utensils. What  increases the risk? You are more likely to develop this condition if you:  Are tired, stressed, or sick.  Are menstruating.  Are pregnant.  Take certain medicines.  Are exposed to cold weather or too much sun. What are the signs or symptoms? Symptoms of a cold sore outbreak go through different stages. These are the stages of a cold sore:  Tingling, itching, or burning is felt 1-2 days before the outbreak.  Fluid-filled blisters appear on the lips, inside the mouth, on the nose, or on the cheeks.  The blisters start to ooze clear fluid.  The blisters dry up, and a yellow crust appears in their place.  The crust falls off. In some cases, other  symptoms can develop during a cold sore outbreak. These can include:  Fever.  Sore throat.  Headache.  Muscle aches.  Swollen neck glands. How is this diagnosed? This condition is diagnosed based on your medical history and a physical exam. Your health care provider may do a blood test or may swab some fluid from your sore and then examine the swab in the lab. How is this treated? There is no cure for cold sores or HSV-1. There is also no vaccine for HSV-1. Most cold sores go away on their own without treatment within 2 weeks. Medicines cannot make the infection go away, but your health care provider may prescribe medicines to:  Help relieve some of the pain associated with the sores.  Work to stop the virus from multiplying.  Shorten healing time. Medicines may be in the form of creams, gels, pills, or a shot. Follow these instructions at home: Medicines  Take or apply over-the-counter and prescription medicines only as told by your health care provider.  Use a cotton-tip swab to apply creams or gels to your sores.  Ask your health care provider if you can take lysine supplements. Research has found that lysine may help heal the cold sore faster and prevent outbreaks. Sore care  Do not touch the sores or pick the scabs.  Wash your hands often. Do not touch your eyes without washing your hands first.  Keep the sores clean and dry.  If directed, apply ice to the sores: ? Put ice in a plastic bag. ? Place a towel between your skin and the bag. ? Leave the ice on for 20 minutes, 2-3 times a day.   Eating and drinking  Eat a soft, bland diet. Avoid eating hot, cold, or salty foods.  Use a straw if it hurts to drink out of a glass.  Eat foods that are rich in lysine, such as meat, fish, and dairy products.  Avoid sugary foods, chocolates, nuts, and grains. These foods are rich in a nutrient called arginine, which can cause the virus to multiply. Lifestyle  Do not kiss,  have oral sex, or share personal items until your sores heal.  Stress, poor sleep, and being out in the sun can trigger outbreaks. Make sure you: ? Do activities that help you relax, such as deep breathing exercises or meditation. ? Get enough sleep. ? Apply sunscreen on your lips before you go out in the sun. Contact a health care provider if:  You have symptoms for more than 2 weeks.  You have pus coming from the sores.  You have redness that is spreading.  You have pain or irritation in your eye.  You get sores on your genitals.  Your sores do not heal within 2 weeks.  You have  frequent cold sore outbreaks. Get help right away if you have:  A fever and your symptoms suddenly get worse.  A headache and confusion.  Fatigue or loss of appetite.  A stiff neck or sensitivity to light. Summary  A cold sore, also called a fever blister, is a small, fluid-filled sore that forms inside the mouth or on the lips, gums, nose, chin, or cheeks.  Most cold sores go away on their own without treatment within 2 weeks. Your health care provider may prescribe medicines to help relieve some of the pain, work to stop the virus from multiplying, and shorten healing time.  Wash your hands often. Do not touch your eyes without washing your hands first.  Do not kiss, have oral sex, or share personal items until your sores heal.  Contact a health care provider if your sores do not heal within 2 weeks. This information is not intended to replace advice given to you by your health care provider. Make sure you discuss any questions you have with your health care provider. Document Revised: 09/26/2018 Document Reviewed: 11/06/2017 Elsevier Patient Education  2021 Elsevier Inc. MD No Physician    [employer], [employer] Fax E-mail Address  [phone]  [address]   [place] [address]     Specialties       [facility]   Hepatitis A;  Hepatitis B Vaccine injection (twinrix vaccine we have here)  What is this medicine? HEPATITIS A VACCINE; HEPATITIS B VACCINE (hep uh TAHY tis A vak SEEN; hep uh TAHY tis B vak SEEN) is a vaccine to protect from an infection with the hepatitis A and B virus. This vaccine does not contain the live viruses. It will not cause a hepatitis infection. This medicine may be used for other purposes; ask your health care provider or pharmacist if you have questions. COMMON BRAND NAME(S): Twinrix What should I tell my health care provider before I take this medicine? They need to know if you have any of these conditions:  bleeding disorder  fever or infection  heart disease  immune system problems  an unusual or allergic reaction to hepatitis A or B vaccine, neomycin, yeast, thimerosal, other medicines, foods, dyes, or preservatives  pregnant or trying to get pregnant  breast-feeding How should I use this medicine? This vaccine is for injection into a muscle. It is given by a health care professional. A copy of Vaccine Information Statements will be given before each vaccination. Read this sheet carefully each time. The sheet may change frequently. Talk to your pediatrician regarding the use of this medicine in children. Special care may be needed. Overdosage: If you think you have taken too much of this medicine contact a poison control center or emergency room at once. NOTE: This medicine is only for you. Do not share this medicine with others. What if I miss a dose? It is important not to miss your dose. Call your doctor or health care professional if you are unable to keep an appointment. What may interact with this medicine?  medicines that suppress your immune function like adalimumab, anakinra, infliximab  medicines to treat cancer  steroid medicines like prednisone or cortisone This list may not describe all possible interactions. Give your health care provider a list of all the  medicines, herbs, non-prescription drugs, or dietary supplements you use. Also tell them if you smoke, drink alcohol, or use illegal drugs. Some items may interact with your medicine. What should I  watch for while using this medicine? See your health care provider for all shots of this vaccine as directed. You must have 3 to 4 shots of this vaccine for protection from hepatitis A and B infection. Tell your doctor right away if you have any serious or unusual side effects after getting this vaccine. What side effects may I notice from receiving this medicine? Side effects that you should report to your doctor or health care professional as soon as possible:  allergic reactions like skin rash, itching or hives, swelling of the face, lips, or tongue  breathing problems  confused, irritated  fast, irregular heartbeat  flu-like syndrome  numb, tingling pain  seizures Side effects that usually do not require medical attention (report to your doctor or health care professional if they continue or are bothersome):  diarrhea  fever  headache  loss of appetite  muscle pain  nausea  pain, redness, swelling, or irritation at site where injected  tiredness This list may not describe all possible side effects. Call your doctor for medical advice about side effects. You may report side effects to FDA at 1-800-FDA-1088. Where should I keep my medicine? This drug is given in a hospital or clinic and will not be stored at home. NOTE: This sheet is a summary. It may not cover all possible information. If you have questions about this medicine, talk to your doctor, pharmacist, or health care provider.  2021 Elsevier/Gold Standard (2007-10-19 15:21:37)  Nonalcoholic Fatty Liver Disease Diet, Adult Nonalcoholic fatty liver disease is a condition that causes fat to build up in and around the liver. The disease makes it harder for the liver to work the way that it should. Following a healthy diet  can help to keep nonalcoholic fatty liver disease under control. It can also help to prevent or improve conditions that are associated with the disease, such as heart disease, diabetes, high blood pressure, and abnormal cholesterol levels. Along with regular exercise, this diet:  Promotes weight loss.  Helps to control blood sugar levels.  Helps to improve the way that the body uses insulin. What are tips for following this plan? Reading food labels Always check food labels for:  The amount of saturated fat in a food. You should limit your intake of saturated fat. Saturated fat is found in foods that come from animals, including meat and dairy products such as butter, cheese, and whole milk.  The amount of fiber in a food. You should choose high-fiber foods such as fruits, vegetables, and whole grains. Try to get 25-30 grams (g) of fiber a day.   Cooking  When cooking, use heart-healthy oils that are high in monounsaturated fats. These include olive oil, canola oil, and avocado oil.  Limit frying or deep-frying foods. Cook foods using healthy methods such as baking, boiling, steaming, and grilling instead. Meal planning  You may want to keep track of how many calories you take in. Eating the right amount of calories will help you achieve a healthy weight. Meeting with a registered dietitian can help you get started.  Limit how often you eat takeout and fast food. These foods are usually very high in fat, salt, and sugar.  Use the glycemic index (GI) to plan your meals. The index tells you how quickly a food will raise your blood sugar. Choose low-GI foods (GI less than 55). These foods take a longer time to raise blood sugar. A registered dietitian can help you identify foods lower on the GI  scale. Lifestyle  You may want to follow a Mediterranean diet. This diet includes a lot of vegetables, lean meats or fish, whole grains, fruits, and healthy oils and fats. What foods can I  eat? Fruits Bananas. Apples. Oranges. Grapes. Papaya. Mango. Pomegranate. Kiwi. Grapefruit. Cherries. Vegetables Lettuce. Spinach. Peas. Beets. Cauliflower. Cabbage. Broccoli. Carrots. Tomatoes. Squash. Eggplant. Herbs. Peppers. Onions. Cucumbers. Brussels sprouts. Yams and sweet potatoes. Beans. Lentils. Grains Whole wheat or whole-grain foods, including breads, crackers, cereals, and pasta. Stone-ground whole wheat. Unsweetened oatmeal. Bulgur. Barley. Quinoa. Brown or wild rice. Corn or whole wheat flour tortillas. Meats and other proteins Lean meats. Poultry. Tofu. Seafood and shellfish. Dairy Low-fat or fat-free dairy products, such as yogurt, cottage cheese, or cheese. Beverages Water. Sugar-free drinks. Tea. Coffee. Low-fat or skim milk. Milk alternatives, such as soy or almond milk. Real fruit juice. Fats and oils Avocado. Canola or olive oil. Nuts and nut butters. Seeds. Seasonings and condiments Mustard. Relish. Low-fat, low-sugar ketchup and barbecue sauce. Low-fat or fat-free mayonnaise. Sweets and desserts Sugar-free sweets. The items listed above may not be a complete list of foods and beverages you can eat. Contact a dietitian for more information.   What foods should I limit or avoid? Meats and other proteins Limit red meat to 1-2 times a week. Dairy [employer]. Fats and oils Palm oil and coconut oil. Fried foods. Other foods Processed foods. Foods that contain a lot of salt or sodium. Sweets and desserts Sweets that contain sugar. Beverages Sweetened drinks, such as sweet tea, milkshakes, iced sweet drinks, and sodas. Alcohol. The items listed above may not be a complete list of foods and beverages you should avoid. Contact a dietitian for more information. Where to find more information The [employer] of Diabetes and Digestive and Kidney Diseases: [email or web address] Summary  Nonalcoholic fatty liver disease is a condition that causes fat to build up in  and around the liver.  Following a healthy diet can help to keep nonalcoholic fatty liver disease under control. Your diet should be rich in fruits, vegetables, whole grains, and lean proteins.  Limit your intake of saturated fat. Saturated fat is found in foods that come from animals, including meat and dairy products such as butter, cheese, and whole milk.  This diet promotes weight loss, helps to control blood sugar levels, and helps to improve the way that the body uses insulin. This information is not intended to replace advice given to you by your health care provider. Make sure you discuss any questions you have with your health care provider. Document Revised: 09/28/2018 Document Reviewed: 06/28/2018 Elsevier Patient Education  2021 Elsevier Inc.  Fatty Liver Disease  The liver converts food into energy, removes toxic material from the blood, makes important proteins, and absorbs necessary vitamins from food. Fatty liver disease occurs when too much fat has built up in your liver cells. Fatty liver disease is also called hepatic steatosis. In many cases, fatty liver disease does not cause symptoms or problems. It is often diagnosed when tests are being done for other reasons. However, over time, fatty liver can cause inflammation that may lead to more serious liver problems, such as scarring of the liver (cirrhosis) and liver failure. Fatty liver is associated with insulin resistance, increased body fat, high blood pressure (hypertension), and high cholesterol. These are features of metabolic syndrome and increase your risk for stroke, diabetes, and heart disease. What are the causes? This condition may be caused by components of metabolic syndrome:  Obesity.  Insulin resistance.  High cholesterol. Other causes:  Alcohol abuse.  Poor nutrition.  Cushing syndrome.  Pregnancy.  Certain drugs.  Poisons.  Some viral infections. What increases the risk? You are more likely  to develop this condition if you:  Abuse alcohol.  Are overweight.  Have diabetes.  Have hepatitis.  Have a high triglyceride level.  Are pregnant. What are the signs or symptoms? Fatty liver disease often does not cause symptoms. If symptoms do develop, they can include:  Fatigue and weakness.  Weight loss.  Confusion.  Nausea, vomiting, or abdominal pain.  Yellowing of your skin and the white parts of your eyes (jaundice).  Itchy skin. How is this diagnosed? This condition may be diagnosed by:  A physical exam and your medical history.  Blood tests.  Imaging tests, such as an ultrasound, CT scan, or MRI.  A liver biopsy. A small sample of liver tissue is removed using a needle. The sample is then looked at under a microscope. How is this treated? Fatty liver disease is often caused by other health conditions. Treatment for fatty liver may involve medicines and lifestyle changes to manage conditions such as:  Alcoholism.  High cholesterol.  Diabetes.  Being overweight or obese. Follow these instructions at home:  Do not drink alcohol. If you have trouble quitting, ask your health care provider how to safely quit with the help of medicine or a supervised program. This is important to keep your condition from getting worse.  Eat a healthy diet as told by your health care provider. Ask your health care provider about working with a dietitian to develop an eating plan.  Exercise regularly. This can help you lose weight and control your cholesterol and diabetes. Talk to your health care provider about an exercise plan and which activities are best for you.  Take over-the-counter and prescription medicines only as told by your health care provider.  Keep all follow-up visits. This is important.   Contact a health care provider if:  You have trouble controlling your: ? Blood sugar. This is especially important if you have diabetes. ? Cholesterol. ? Drinking of  alcohol. Get help right away if:  You have abdominal pain.  You have jaundice.  You have nausea and are vomiting.  You vomit blood or material that looks like coffee grounds.  You have stools that are black, tar-like, or bloody. Summary  Fatty liver disease develops when too much fat builds up in the cells of your liver.  Fatty liver disease often causes no symptoms or problems. However, over time, fatty liver can cause inflammation that may lead to more serious liver problems, such as scarring of the liver (cirrhosis).  You are more likely to develop this condition if you abuse alcohol, are pregnant, are overweight, have diabetes, have hepatitis, or have high triglyceride or cholesterol levels.  Contact your health care provider if you have trouble controlling your blood sugar, cholesterol, or drinking of alcohol. This information is not intended to replace advice given to you by your health care provider. Make sure you discuss any questions you have with your health care provider. Document Revised: 03/19/2020 Document Reviewed: 03/19/2020 Elsevier Patient Education  2021 [employer].

## 2020-11-04 NOTE — Telephone Encounter (Signed)
Patient called in and stated that theprescription for the acyclovir ointment (ZOVIRAX) 5 % is $187.00 cannot afford

## 2020-11-05 MED ORDER — ZOVIRAX 5 % EX OINT
1.0000 | TOPICAL_OINTMENT | Freq: Four times a day (QID) | CUTANEOUS | 11 refills | Status: DC | PRN
Start: 2020-11-05 — End: 2020-11-05

## 2020-11-05 MED ORDER — ZOVIRAX 5 % EX OINT: 1.0000 "application " | TOPICAL_OINTMENT | Freq: Four times a day (QID) | CUTANEOUS | 11 refills | Status: DC | PRN

## 2020-11-05 MED ORDER — PENCICLOVIR 1 % EX CREA: 1.0000 "application " | TOPICAL_CREAM | CUTANEOUS | 11 refills | Status: DC

## 2020-11-05 NOTE — Telephone Encounter (Signed)
Pharmacy states that insurance does cover the acyclovir and the covered price is $187.00.   Pharmacy states that Denavir would have to be prescribed and ran through their system before they would know if this is covered or the price.

## 2020-11-05 NOTE — Addendum Note (Signed)
Addended by: Quentin Ore on: 11/05/2020 04:01 PM   Modules accepted: Orders

## 2020-11-05 NOTE — Telephone Encounter (Signed)
PT called back to inform us that he is soon heading out of town in 20 mins to board a plane and was wanting to let us know that his insurance advise him of a med that they would cover. He would like to speak to the CMA asap.

## 2020-11-05 NOTE — Telephone Encounter (Signed)
Spoke with the Patient and informed him of the below mentioned by the pharmacy.   Patient states he spoke with insurance and they would cover brand name Zovirax. This was sent in for Patient to [facility] pharmacy as he states that he is leaving on vacation but has a pretty bad fever blister currently.

## 2020-11-05 NOTE — Telephone Encounter (Signed)
Patient states that with [facility] the price only went down by $5. States that the oral does not help his outbreaks and the only thing that works is the ointment.   Will call pharmacy to see if this is needing a PA or if that price is with insurance coverage,

## 2020-11-18 ENCOUNTER — Other Ambulatory Visit (INDEPENDENT_AMBULATORY_CARE_PROVIDER_SITE_OTHER): Payer: BC Managed Care – PPO

## 2020-11-18 ENCOUNTER — Other Ambulatory Visit: Payer: Self-pay

## 2020-11-18 DIAGNOSIS — I10 Essential (primary) hypertension: Secondary | ICD-10-CM | POA: Diagnosis not present

## 2020-11-18 DIAGNOSIS — D696 Thrombocytopenia, unspecified: Secondary | ICD-10-CM

## 2020-11-18 DIAGNOSIS — E559 Vitamin D deficiency, unspecified: Secondary | ICD-10-CM

## 2020-11-18 DIAGNOSIS — R7303 Prediabetes: Secondary | ICD-10-CM | POA: Diagnosis not present

## 2020-11-18 LAB — LIPID PANEL
Cholesterol: 191 mg/dL (ref 0–200)
HDL: 45 mg/dL (ref >39.00)
LDL Cholesterol: 128 mg/dL — ABNORMAL HIGH (ref 0–99)
NonHDL: 146.03
Total CHOL/HDL Ratio: 4
Triglycerides: 91 mg/dL (ref 0.0–149.0)
VLDL: 18.2 mg/dL (ref 0.0–40.0)

## 2020-11-18 LAB — COMPREHENSIVE METABOLIC PANEL
ALT: 63 U/L — ABNORMAL HIGH (ref 0–53)
AST: 26 U/L (ref 0–37)
Albumin: 4.3 g/dL (ref 3.5–5.2)
Alkaline Phosphatase: 54 U/L (ref 39–117)
BUN: 16 mg/dL (ref 6–23)
CO2: 31 mEq/L (ref 19–32)
Calcium: 9.4 mg/dL (ref 8.4–10.5)
Chloride: 101 mEq/L (ref 96–112)
Creatinine, Ser: 1.08 mg/dL (ref 0.40–1.50)
GFR: 75.55 mL/min (ref >60.00)
Glucose, Bld: 89 mg/dL (ref 70–99)
Potassium: 3.6 mEq/L (ref 3.5–5.1)
Sodium: 140 mEq/L (ref 135–145)
Total Bilirubin: 0.8 mg/dL (ref 0.2–1.2)
Total Protein: 6.5 g/dL (ref 6.0–8.3)

## 2020-11-18 LAB — CBC WITH DIFFERENTIAL/PLATELET
Basophils Absolute: 0 10*3/uL (ref 0.0–0.1)
Basophils Relative: 0.9 % (ref 0.0–3.0)
Eosinophils Absolute: 0.2 10*3/uL (ref 0.0–0.7)
Eosinophils Relative: 3.4 % (ref 0.0–5.0)
HCT: 43.5 % (ref 39.0–52.0)
Hemoglobin: 15 g/dL (ref 13.0–17.0)
Lymphocytes Relative: 33.1 % (ref 12.0–46.0)
Lymphs Abs: 1.8 10*3/uL (ref 0.7–4.0)
MCHC: 34.5 g/dL (ref 30.0–36.0)
MCV: 92.2 fl (ref 78.0–100.0)
Monocytes Absolute: 0.5 10*3/uL (ref 0.1–1.0)
Monocytes Relative: 8.4 % (ref 3.0–12.0)
Neutro Abs: 2.9 10*3/uL (ref 1.4–7.7)
Neutrophils Relative %: 54.2 % (ref 43.0–77.0)
Platelets: 159 10*3/uL (ref 150.0–400.0)
RBC: 4.72 Mil/uL (ref 4.22–5.81)
RDW: 13.3 % (ref 11.5–15.5)
WBC: 5.4 10*3/uL (ref 4.0–10.5)

## 2020-11-18 LAB — HEMOGLOBIN A1C: Hgb A1c MFr Bld: 5.7 % (ref 4.6–6.5)

## 2020-11-18 LAB — VITAMIN D 25 HYDROXY (VIT D DEFICIENCY, FRACTURES): VITD: 25.15 ng/mL — ABNORMAL LOW (ref 30.00–100.00)

## 2020-11-23 NOTE — Progress Notes (Signed)
Left message to return call. Letter sent

## 2021-01-13 DIAGNOSIS — M9903 Segmental and somatic dysfunction of lumbar region: Secondary | ICD-10-CM | POA: Diagnosis not present

## 2021-01-13 DIAGNOSIS — M9902 Segmental and somatic dysfunction of thoracic region: Secondary | ICD-10-CM | POA: Diagnosis not present

## 2021-01-13 DIAGNOSIS — M62830 Muscle spasm of back: Secondary | ICD-10-CM | POA: Diagnosis not present

## 2021-01-13 DIAGNOSIS — M9905 Segmental and somatic dysfunction of pelvic region: Secondary | ICD-10-CM | POA: Diagnosis not present

## 2021-01-15 DIAGNOSIS — M62830 Muscle spasm of back: Secondary | ICD-10-CM | POA: Diagnosis not present

## 2021-01-15 DIAGNOSIS — M9905 Segmental and somatic dysfunction of pelvic region: Secondary | ICD-10-CM | POA: Diagnosis not present

## 2021-01-15 DIAGNOSIS — M9903 Segmental and somatic dysfunction of lumbar region: Secondary | ICD-10-CM | POA: Diagnosis not present

## 2021-01-15 DIAGNOSIS — M9902 Segmental and somatic dysfunction of thoracic region: Secondary | ICD-10-CM | POA: Diagnosis not present

## 2021-01-18 DIAGNOSIS — M9902 Segmental and somatic dysfunction of thoracic region: Secondary | ICD-10-CM | POA: Diagnosis not present

## 2021-01-18 DIAGNOSIS — M9903 Segmental and somatic dysfunction of lumbar region: Secondary | ICD-10-CM | POA: Diagnosis not present

## 2021-01-18 DIAGNOSIS — M62830 Muscle spasm of back: Secondary | ICD-10-CM | POA: Diagnosis not present

## 2021-01-18 DIAGNOSIS — M9905 Segmental and somatic dysfunction of pelvic region: Secondary | ICD-10-CM | POA: Diagnosis not present

## 2021-01-21 DIAGNOSIS — M62830 Muscle spasm of back: Secondary | ICD-10-CM | POA: Diagnosis not present

## 2021-01-21 DIAGNOSIS — M9902 Segmental and somatic dysfunction of thoracic region: Secondary | ICD-10-CM | POA: Diagnosis not present

## 2021-01-21 DIAGNOSIS — M9903 Segmental and somatic dysfunction of lumbar region: Secondary | ICD-10-CM | POA: Diagnosis not present

## 2021-01-21 DIAGNOSIS — M9905 Segmental and somatic dysfunction of pelvic region: Secondary | ICD-10-CM | POA: Diagnosis not present

## 2021-01-25 DIAGNOSIS — M9905 Segmental and somatic dysfunction of pelvic region: Secondary | ICD-10-CM | POA: Diagnosis not present

## 2021-01-25 DIAGNOSIS — M9903 Segmental and somatic dysfunction of lumbar region: Secondary | ICD-10-CM | POA: Diagnosis not present

## 2021-01-25 DIAGNOSIS — M9902 Segmental and somatic dysfunction of thoracic region: Secondary | ICD-10-CM | POA: Diagnosis not present

## 2021-01-25 DIAGNOSIS — M62830 Muscle spasm of back: Secondary | ICD-10-CM | POA: Diagnosis not present

## 2021-01-27 DIAGNOSIS — M9905 Segmental and somatic dysfunction of pelvic region: Secondary | ICD-10-CM | POA: Diagnosis not present

## 2021-01-27 DIAGNOSIS — M9903 Segmental and somatic dysfunction of lumbar region: Secondary | ICD-10-CM | POA: Diagnosis not present

## 2021-01-27 DIAGNOSIS — M9902 Segmental and somatic dysfunction of thoracic region: Secondary | ICD-10-CM | POA: Diagnosis not present

## 2021-01-27 DIAGNOSIS — M62830 Muscle spasm of back: Secondary | ICD-10-CM | POA: Diagnosis not present

## 2021-02-03 DIAGNOSIS — M62830 Muscle spasm of back: Secondary | ICD-10-CM | POA: Diagnosis not present

## 2021-02-03 DIAGNOSIS — M9902 Segmental and somatic dysfunction of thoracic region: Secondary | ICD-10-CM | POA: Diagnosis not present

## 2021-02-03 DIAGNOSIS — M9903 Segmental and somatic dysfunction of lumbar region: Secondary | ICD-10-CM | POA: Diagnosis not present

## 2021-02-03 DIAGNOSIS — M9905 Segmental and somatic dysfunction of pelvic region: Secondary | ICD-10-CM | POA: Diagnosis not present

## 2021-02-04 ENCOUNTER — Telehealth: Payer: Self-pay | Admitting: Internal Medicine

## 2021-02-04 NOTE — Telephone Encounter (Signed)
Patient is calling to request a refill on his losartan-hydrochlorothiazide (HYZAAR) 100-25 MG tablet due to his pharmacy stating that it will not be ready for 5-7 days.He only has two pills left and would like for his [facility] [address] in [place].

## 2021-02-04 NOTE — Telephone Encounter (Signed)
[facility] and confirmed that they had the patients medication. Was told by the pharmacist that it has been filled and is ready for pick up. Pt was informed and states that he will call himself and go to pick up his medication today.

## 2021-02-08 DIAGNOSIS — M62830 Muscle spasm of back: Secondary | ICD-10-CM | POA: Diagnosis not present

## 2021-02-08 DIAGNOSIS — M9905 Segmental and somatic dysfunction of pelvic region: Secondary | ICD-10-CM | POA: Diagnosis not present

## 2021-02-08 DIAGNOSIS — M9903 Segmental and somatic dysfunction of lumbar region: Secondary | ICD-10-CM | POA: Diagnosis not present

## 2021-02-08 DIAGNOSIS — M9902 Segmental and somatic dysfunction of thoracic region: Secondary | ICD-10-CM | POA: Diagnosis not present

## 2021-02-15 DIAGNOSIS — M9903 Segmental and somatic dysfunction of lumbar region: Secondary | ICD-10-CM | POA: Diagnosis not present

## 2021-02-15 DIAGNOSIS — M62830 Muscle spasm of back: Secondary | ICD-10-CM | POA: Diagnosis not present

## 2021-02-15 DIAGNOSIS — M9902 Segmental and somatic dysfunction of thoracic region: Secondary | ICD-10-CM | POA: Diagnosis not present

## 2021-02-15 DIAGNOSIS — M9905 Segmental and somatic dysfunction of pelvic region: Secondary | ICD-10-CM | POA: Diagnosis not present

## 2021-02-24 DIAGNOSIS — M9902 Segmental and somatic dysfunction of thoracic region: Secondary | ICD-10-CM | POA: Diagnosis not present

## 2021-02-24 DIAGNOSIS — M9903 Segmental and somatic dysfunction of lumbar region: Secondary | ICD-10-CM | POA: Diagnosis not present

## 2021-02-24 DIAGNOSIS — M62830 Muscle spasm of back: Secondary | ICD-10-CM | POA: Diagnosis not present

## 2021-02-24 DIAGNOSIS — M9905 Segmental and somatic dysfunction of pelvic region: Secondary | ICD-10-CM | POA: Diagnosis not present

## 2021-03-04 DIAGNOSIS — M9903 Segmental and somatic dysfunction of lumbar region: Secondary | ICD-10-CM | POA: Diagnosis not present

## 2021-03-04 DIAGNOSIS — M9902 Segmental and somatic dysfunction of thoracic region: Secondary | ICD-10-CM | POA: Diagnosis not present

## 2021-03-04 DIAGNOSIS — M9905 Segmental and somatic dysfunction of pelvic region: Secondary | ICD-10-CM | POA: Diagnosis not present

## 2021-03-04 DIAGNOSIS — M62830 Muscle spasm of back: Secondary | ICD-10-CM | POA: Diagnosis not present

## 2021-03-31 DIAGNOSIS — D225 Melanocytic nevi of trunk: Secondary | ICD-10-CM | POA: Diagnosis not present

## 2021-03-31 DIAGNOSIS — D224 Melanocytic nevi of scalp and neck: Secondary | ICD-10-CM | POA: Diagnosis not present

## 2021-03-31 DIAGNOSIS — L821 Other seborrheic keratosis: Secondary | ICD-10-CM | POA: Diagnosis not present

## 2021-03-31 DIAGNOSIS — D2362 Other benign neoplasm of skin of left upper limb, including shoulder: Secondary | ICD-10-CM | POA: Diagnosis not present

## 2021-04-13 ENCOUNTER — Ambulatory Visit (INDEPENDENT_AMBULATORY_CARE_PROVIDER_SITE_OTHER): Payer: BC Managed Care – PPO | Admitting: Urology

## 2021-04-13 ENCOUNTER — Other Ambulatory Visit: Payer: Self-pay | Admitting: Urology

## 2021-04-13 ENCOUNTER — Other Ambulatory Visit: Payer: Self-pay

## 2021-04-13 ENCOUNTER — Encounter: Payer: Self-pay | Admitting: Urology

## 2021-04-13 VITALS — BP 138/93 | HR 81 | Ht 72.0 in | Wt 220.0 lb

## 2021-04-13 DIAGNOSIS — N50819 Testicular pain, unspecified: Secondary | ICD-10-CM | POA: Diagnosis not present

## 2021-04-13 DIAGNOSIS — N433 Hydrocele, unspecified: Secondary | ICD-10-CM

## 2021-04-13 LAB — URINALYSIS, COMPLETE
Bilirubin, UA: NEGATIVE
Glucose, UA: NEGATIVE
Ketones, UA: NEGATIVE
Leukocytes,UA: NEGATIVE
Nitrite, UA: NEGATIVE
Protein,UA: NEGATIVE
RBC, UA: NEGATIVE
Specific Gravity, UA: 1.015 (ref 1.005–1.030)
Urobilinogen, Ur: 0.2 mg/dL (ref 0.2–1.0)
pH, UA: 7 (ref 5.0–7.5)

## 2021-04-13 NOTE — H&P (View-Only) (Signed)
04/13/21 4:37 PM   Antion Andres [facility] [birth year] [id number]  Referring provider:  McLean-Scocuzza, Pasty Spillers, MD [address] [place],  [place] [address] Chief Complaint  Patient presents with   Testicle Pain     HPI: Jonathan Blackwell is a 60 y.o.male who presents today for further evaluation of testicular pain and swelling.   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  Over the past 3 months, the size of his right hemiscrotum has stabilized.  He primarily feels a dull ache and fullness of in his scrotum.  Its not overtly painful.  He does have some discomfort sometimes when he hits it or rolls over in bed.  It is bothersome to him.  No urinary issues.  He denies any groin pain.  He reports the he had a vasectomy years ago.    PMH: Past Medical History:  Diagnosis Date   Allergic rhinitis due to pollen    COVID-19    01/06/20 +    Fatty liver    HTN (hypertension)    Hyperlipidemia    ITP (idiopathic thrombocytopenic purpura)    off prednisone since 02/2010, ?etiology per pt 2/2 antibiotics     Surgical History: Past Surgical History:  Procedure Laterality Date   NASAL SINUS SURGERY      Home Medications:  Allergies as of 04/13/2021       Reactions   Hydrocodone-chlorpheniramine Itching        Medication List        Accurate as of April 13, 2021  4:37 PM. If you have any questions, ask your nurse or doctor.          STOP taking these medications    penciclovir 1 % cream Commonly known as: DENAVIR Stopped by: Vanna Scotland, MD   zolpidem 5 MG tablet Commonly known as: Ambien Stopped by: Vanna Scotland, MD   Zovirax 5 % Generic drug: acyclovir ointment Stopped by: Vanna Scotland, MD       TAKE these medications    fluticasone 50 MCG/ACT nasal spray Commonly known as: FLONASE PLACE 2 SPRAYS  INTO BOTH NOSTRILS DAILY.   losartan-hydrochlorothiazide 100-25 MG tablet Commonly known as: HYZAAR TAKE 1 TABLET BY MOUTH DAILY IN THE MORNING        Allergies:  Allergies  Allergen Reactions   Hydrocodone-Chlorpheniramine Itching    Family History: Family History  Problem Relation Age of Onset   Arthritis Other    Hypertension Father     Social History:  reports that he has never smoked. He has never used smokeless tobacco. He reports current alcohol use. He reports that he does not use drugs.   Physical Exam: BP (!) 138/93   Pulse 81   Ht 6' (1.829 m)   Wt 220 lb (99.8 kg)   BMI 29.84 kg/m   Constitutional:  Alert and oriented, No acute distress. HEENT: [place] AT, moist mucus membranes.  Trachea midline, no masses. Cardiovascular: No clubbing, cyanosis, or edema. Respiratory: Normal respiratory effort, no increased work of breathing. GU: No CVA tenderness scrotum was enlarged on right consisent with softball size hydrocele which was discrete and palpably blind-ending.  No inguinal hernias palpated. Skin: No rashes, bruises or suspicious lesions. Neurologic: Grossly intact, no focal deficits, moving all 4 extremities. Psychiatric: Normal mood and affect.  Laboratory Data:  Lab Results  Component Value Date   CREATININE 1.08 11/18/2020     Lab Results  Component Value Date   HGBA1C 5.7 11/18/2020    Urinalysis Unremarkable    Assessment & Plan:    Right hydrocele  - On exam today scrotum was noted to be enlarged consist with hydrocele; no concern for concomitant hernia  - Discussed the different treatment options such as right hydrocelectomy versus percutaneous drainage with sclerotic agent.    -We discussed the preoperative and postoperative course along with postoperative restrictions and recovery.  -He is interested in hydrocelectomy.  We discussed the risk of bleeding, infection, damage surrounding structures, recurrence rate approximately 10%.   All questions were answered.  -He does have a personal history of ITP however his platelets have been normal in the recent past.  Will recheck with preop labs.   I,Kailey Littlejohn,acting as a Neurosurgeon for Vanna Scotland, MD.,have documented all relevant documentation on the behalf of Vanna Scotland, MD,as directed by  Vanna Scotland, MD while in the presence of Vanna Scotland, MD.  I have reviewed the above documentation for accuracy and completeness, and I agree with the above.   Vanna Scotland, MD   [facility] [address], [address] [place], [place] [address] [phone]

## 2021-04-13 NOTE — Progress Notes (Signed)
04/13/21 4:37 PM   Shloma Roggenkamp [facility] [birth year] [id number]  Referring provider:  McLean-Scocuzza, Pasty Spillers, MD [address] [place],  [place] [address] Chief Complaint  Patient presents with   Testicle Pain     HPI: Jonathan Blackwell is a 60 y.o.male who presents today for further evaluation of testicular pain and swelling.   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  Over the past 3 months, the size of his right hemiscrotum has stabilized.  He primarily feels a dull ache and fullness of in his scrotum.  Its not overtly painful.  He does have some discomfort sometimes when he hits it or rolls over in bed.  It is bothersome to him.  No urinary issues.  He denies any groin pain.  He reports the he had a vasectomy years ago.    PMH: Past Medical History:  Diagnosis Date   Allergic rhinitis due to pollen    COVID-19    01/06/20 +    Fatty liver    HTN (hypertension)    Hyperlipidemia    ITP (idiopathic thrombocytopenic purpura)    off prednisone since 02/2010, ?etiology per pt 2/2 antibiotics     Surgical History: Past Surgical History:  Procedure Laterality Date   NASAL SINUS SURGERY      Home Medications:  Allergies as of 04/13/2021       Reactions   Hydrocodone-chlorpheniramine Itching        Medication List        Accurate as of April 13, 2021  4:37 PM. If you have any questions, ask your nurse or doctor.          STOP taking these medications    penciclovir 1 % cream Commonly known as: DENAVIR Stopped by: Vanna Scotland, MD   zolpidem 5 MG tablet Commonly known as: Ambien Stopped by: Vanna Scotland, MD   Zovirax 5 % Generic drug: acyclovir ointment Stopped by: Vanna Scotland, MD       TAKE these medications    fluticasone 50 MCG/ACT nasal spray Commonly known as: FLONASE PLACE 2 SPRAYS  INTO BOTH NOSTRILS DAILY.   losartan-hydrochlorothiazide 100-25 MG tablet Commonly known as: HYZAAR TAKE 1 TABLET BY MOUTH DAILY IN THE MORNING        Allergies:  Allergies  Allergen Reactions   Hydrocodone-Chlorpheniramine Itching    Family History: Family History  Problem Relation Age of Onset   Arthritis Other    Hypertension Father     Social History:  reports that he has never smoked. He has never used smokeless tobacco. He reports current alcohol use. He reports that he does not use drugs.   Physical Exam: BP (!) 138/93   Pulse 81   Ht 6' (1.829 m)   Wt 220 lb (99.8 kg)   BMI 29.84 kg/m   Constitutional:  Alert and oriented, No acute distress. HEENT: [place] AT, moist mucus membranes.  Trachea midline, no masses. Cardiovascular: No clubbing, cyanosis, or edema. Respiratory: Normal respiratory effort, no increased work of breathing. GU: No CVA tenderness scrotum was enlarged on right consisent with softball size hydrocele which was discrete and palpably blind-ending.  No inguinal hernias palpated. Skin: No rashes, bruises or suspicious lesions. Neurologic: Grossly intact, no focal deficits, moving all 4 extremities. Psychiatric: Normal mood and affect.  Laboratory Data:  Lab Results  Component Value Date   CREATININE 1.08 11/18/2020     Lab Results  Component Value Date   HGBA1C 5.7 11/18/2020    Urinalysis Unremarkable    Assessment & Plan:    Right hydrocele  - On exam today scrotum was noted to be enlarged consist with hydrocele; no concern for concomitant hernia  - Discussed the different treatment options such as right hydrocelectomy versus percutaneous drainage with sclerotic agent.    -We discussed the preoperative and postoperative course along with postoperative restrictions and recovery.  -He is interested in hydrocelectomy.  We discussed the risk of bleeding, infection, damage surrounding structures, recurrence rate approximately 10%.   All questions were answered.  -He does have a personal history of ITP however his platelets have been normal in the recent past.  Will recheck with preop labs.   I,Kailey Littlejohn,acting as a Neurosurgeon for Vanna Scotland, MD.,have documented all relevant documentation on the behalf of Vanna Scotland, MD,as directed by  Vanna Scotland, MD while in the presence of Vanna Scotland, MD.  I have reviewed the above documentation for accuracy and completeness, and I agree with the above.   Vanna Scotland, MD   [facility] [address], [address] [place], [place] [address] [phone]

## 2021-04-13 NOTE — Progress Notes (Signed)
Surgical Physician Order Form  * Scheduling expectation : Next Available  *Length of Case:   *Clearance needed: no  *Anticoagulation Instructions: Hold all anticoagulants  *Aspirin Instructions: Hold Aspirin and Plavix  *Post-op visit Date/Instructions:  1 month follow up  *Diagnosis:  Right hydrocele  *Procedure:  Right hydrocelectomy  -Admit type: OUTpatient  -Anesthesia: General  -VTE Prophylaxis Standing Order SCD's       Other:   -Standing Lab Orders Per Anesthesia    Lab other: CBC  -Standing Test orders EKG/Chest x-ray per Anesthesia       Test other:   - Medications:     Ancef 1gm IV   Other Instructions:

## 2021-04-13 NOTE — Patient Instructions (Signed)
Hydrocele, Adult A hydrocele is a collection of fluid in the loose pouch of skin that holds the testicles (scrotum). This may happen because: The amount of fluid produced in the scrotum is not absorbed by the rest of the body. Fluid from the abdomen fills the scrotum. Normally, the testicles develop in the abdomen then drop into to the scrotum before birth. The tube that the testicles travel through usually closes after the testicles drop. If the tube does not close, fluid from the abdomen can fill the scrotum. This is not very common in adults. What are the causes? A hydrocele may be caused by: An injury to the scrotum. An infection. Decreased blood flow to the scrotum. Twisting of a testicle (testicular torsion). A birth defect. A tumor or cancer of the testicle. Sometimes, the cause is not known. What are the signs or symptoms? A hydrocele feels like a water-filled balloon. It may also feel heavy. Other symptoms include: Swelling of the scrotum. The swelling may decrease when you lie down. You may also notice more swelling at night than in the morning. This is called a communicating hydrocele, in which the fluid in the scrotum goes back into the abdominal cavity when the position of the scrotum changes. Swelling of the groin. Mild discomfort in the scrotum. Pain. This can develop if the hydrocele was caused by infection or twisting. The larger the hydrocele, the more likely you are to have pain. Swelling may also cause pain. How is this diagnosed? This condition may be diagnosed based on a physical exam and your medical history. You may also have tests, including: Imaging tests, such as ultrasound. A transillumination test. This test takes place in a dark room; a light is placed on the skin of the scrotum. Clear liquid will not impede the light and the scrotum will be illuminated. This helps a health care provider distinguish a hydrocele from a tumor. Blood or urine tests. How is this  treated? Most hydroceles go away on their own. If you have no discomfort or pain, your health care provider may suggest close monitoring of your condition (called watch and wait or watchful waiting) until the condition goes away or symptoms develop. If treatment is needed, it may include: Treating an underlying condition. This may include taking an antibiotic medicine to treat an infection. Having surgery to stop fluid from collecting in the scrotum. Having surgery to drain the fluid. Surgery may include: Hydrocelectomy. For this procedure, an incision is made in the scrotum to remove the fluid sac. Needle aspiration. A needle is used to drain fluid. However, the fluid buildup will come back quickly and may lead to an infection of the scrotum. This treatment is rarely used. Follow these instructions at home: Medicines Take over-the-counter and prescription medicines only as told by your health care provider. If you were prescribed an antibiotic medicine, take it as told by your health care provider. Do not stop taking the antibiotic even if you start to feel better. General instructions Watch the hydrocele for any changes. Keep all follow-up visits as told by your health care provider. This is important. Contact a health care provider if: You notice any changes in the hydrocele. The swelling in your scrotum or groin gets worse. The hydrocele becomes red, firm, painful, or tender to the touch. You have a fever. Get help right away if you: Develop a lot of pain or your pain becomes worse. Have shaking chills. Have a high fever. Summary A hydrocele is a collection  of fluid in the loose pouch of skin that holds the testicles (scrotum). A hydrocele can cause swelling, discomfort, and pain. In adults, the cause of a hydrocele may not be known. However, it is sometimes caused by an infection or a rotation and twisting of the scrotum. Treatment may not be needed. Hydroceles often go away on their  own. If a hydrocele causes pain, treatment may be given to ease the pain. This information is not intended to replace advice given to you by your health care provider. Make sure you discuss any questions you have with your health care provider. Document Revised: 08/24/2020 Document Reviewed: 04/03/2019 Elsevier Patient Education  2022 Elsevier Inc.    Hydrocelectomy, Adult A hydrocelectomy is a surgical procedure to remove a collection of fluid (hydrocele) from the scrotum, which is the pouch that holds the testicles. You may need to have this procedure if a hydrocele is causing painful swelling in your scrotum. Tell a health care provider about: Any allergies you have. All medicines you are taking, including vitamins, herbs, eye drops, creams, and over-the-counter medicines. Any problems you or family members have had with anesthetic medicines. Any blood disorders you have. Any surgeries you have had. Any medical conditions you have. What are the risks? Generally, this is a safe procedure. However, problems may occur, including: Bleeding into the scrotum (scrotal hematoma). Damage to nearby structures or organs, including to the testicle or the tube that carries sperm out of the testicle (vas deferens). Infection. Allergic reactions to medicines. What happens before the procedure? Staying hydrated Follow instructions from your health care provider about hydration, which may include: Up to 2 hours before the procedure - you may continue to drink clear liquids, such as water, clear fruit juice, black coffee, and plain tea. Eating and drinking restrictions Follow instructions from your health care provider about eating and drinking, which may include: 8 hours before the procedure - stop eating heavy meals or foods, such as meat, fried foods, or fatty foods. 6 hours before the procedure - stop eating light meals or foods, such as toast or cereal. 6 hours before the procedure - stop  drinking milk or drinks that contain milk. 2 hours before the procedure - stop drinking clear liquids. Medicines Ask your health care provider about: Changing or stopping your regular medicines. This is especially important if you are taking diabetes medicines or blood thinners. Taking medicines such as aspirin and ibuprofen. These medicines can thin your blood. Do not take these medicines unless your health care provider tells you to take them. Taking over-the-counter medicines, vitamins, herbs, and supplements. General instructions Do not use any products that contain nicotine or tobacco for at least 4 weeks before the procedure. These products include cigarettes, e-cigarettes, and chewing tobacco. If you need help quitting, ask your health care provider. Plan to have someone take you home from the hospital or clinic. Plan to have a responsible adult care for you for at least 24 hours after you leave the hospital or clinic. This is important. Ask your health care provider: How your surgery site will be marked. What steps will be taken to help prevent infection. These may include: Removing hair at the surgery site. Washing skin with a germ-killing soap. Taking antibiotic medicine. What happens during the procedure? An IV will be inserted into one of your veins. You will be given one or more of the following: A medicine to make you relax (sedative). A medicine to make you fall asleep (general anesthetic).  A small incision will be made through the skin of your scrotum. Your testicle and the hydrocele will be located, and the hydrocele sac will be opened with an incision. The fluid will be drained from the hydrocele. Part of the hydrocele sac may be removed. The hydrocele will be closed with stitches that dissolve (absorbable sutures). This prevents fluid from building up again. If your hydrocele is large, you may have a thin, rubber drain placed to allow fluid to drain after the  procedure. The incision in your scrotum will be closed with absorbable sutures, skin glue, or adhesives. A bandage (dressing) will be placed over the incision. The dressing may be held in place with an athletic support strap (scrotal support). The procedure may vary among health care providers and hospitals. What happens after the procedure?  Your blood pressure, heart rate, breathing rate, and blood oxygen level will be monitored until you leave the hospital or clinic. You will be given pain medicine as needed. Your IV will be removed, and your insertion site will be checked for bleeding. Do not drive for 24 hours if you were given a sedative during your procedure. You may need to wear a scrotal support. This holds the dressing in place and supports your scrotum. Summary A hydrocelectomy is a surgical procedure to remove a collection of fluid (hydrocele) from the scrotum, which is the pouch that holds the testicles. You may need to have this procedure if a hydrocele is causing painful swelling in your scrotum. During the procedure, the hydrocele will be drained and then closed with stitches that dissolve (absorbable sutures). This prevents fluid from building up again. If your hydrocele is large, you may have a thin, rubber drain placed to allow fluid to drain after the procedure. You may need to wear a scrotal support after your procedure. This holds the dressing in place and supports your scrotum. This information is not intended to replace advice given to you by your health care provider. Make sure you discuss any questions you have with your health care provider. Document Revised: 08/24/2020 Document Reviewed: 10/30/2018 Elsevier Patient Education  2022 [employer].

## 2021-04-14 ENCOUNTER — Telehealth: Payer: Self-pay | Admitting: Urology

## 2021-04-14 NOTE — Progress Notes (Addendum)
[facility] Posting Form   Surgery Date/Time: Date: 04/26/2021  Surgeon: Dr. Vanna Scotland, MD  Surgery Location: Day Surgery  Inpt ( No  )   Outpt (Yes)   Obs ( No  )   Diagnosis: N43.3 Right Hydrocele  -CPT: 88325  Surgery: Right Hydrocelectomy  Stop Anticoagulations: No  Cardiac/Medical/Pulmonary Clearance needed: no  *Orders entered into EPIC  Date: 04/14/21   *Case booked in [place]  Date: 04/14/21  *Notified pt of Surgery: Date: 04/14/21  PRE-OP UA & CX: No, but would like CBC  *Placed into Prior Authorization Work [place] Date: 04/14/21   Assistant/laser/rep:No

## 2021-04-14 NOTE — Telephone Encounter (Signed)
Per Dr. Apolinar Junes Patient is to be scheduled for Right Hydrocelectomy  Mr. Dudash was contacted and possible surgical dates were discussed, 04/26/21 was agreed upon for surgery.  Patient was directed to call [phone] between 1-3pm the day before surgery to find out surgical arrival time.  Instructions were given not to eat or drink from midnight on the night before surgery and have a driver for the day of surgery. On the surgery day patient was instructed to enter through the [facility] entrance of [facility] report the Same Day Surgery desk.   Pre-Admit Testing will be in contact via phone to set up an interview with the anesthesia team to review your history and medications prior to surgery.   Reminder of this information was sent via Mychart to the patient.   Patient is to hold anticoag's, Plavix and ASA per Dr. Apolinar Junes.

## 2021-04-23 ENCOUNTER — Other Ambulatory Visit: Payer: Self-pay

## 2021-04-23 ENCOUNTER — Other Ambulatory Visit
Admission: RE | Admit: 2021-04-23 | Discharge: 2021-04-23 | Disposition: A | Discharge status: Home / Self Care | Payer: BC Managed Care – PPO | Source: Ambulatory Visit | Attending: Urology | Admitting: Urology

## 2021-04-23 ENCOUNTER — Encounter
Admission: RE | Admit: 2021-04-23 | Discharge: 2021-04-23 | Disposition: A | Discharge status: Home / Self Care | Payer: BC Managed Care – PPO | Source: Ambulatory Visit | Attending: Urology | Admitting: Urology

## 2021-04-23 VITALS — Ht 72.0 in | Wt 215.0 lb

## 2021-04-23 DIAGNOSIS — K760 Fatty (change of) liver, not elsewhere classified: Secondary | ICD-10-CM | POA: Diagnosis not present

## 2021-04-23 DIAGNOSIS — Z8616 Personal history of COVID-19: Secondary | ICD-10-CM | POA: Diagnosis not present

## 2021-04-23 DIAGNOSIS — I10 Essential (primary) hypertension: Secondary | ICD-10-CM | POA: Diagnosis not present

## 2021-04-23 DIAGNOSIS — Z01812 Encounter for preprocedural laboratory examination: Secondary | ICD-10-CM

## 2021-04-23 DIAGNOSIS — N433 Hydrocele, unspecified: Secondary | ICD-10-CM | POA: Insufficient documentation

## 2021-04-23 DIAGNOSIS — Z01818 Encounter for other preprocedural examination: Secondary | ICD-10-CM | POA: Insufficient documentation

## 2021-04-23 LAB — CBC
HCT: 45.5 % (ref 39.0–52.0)
Hemoglobin: 15.2 g/dL (ref 13.0–17.0)
MCH: 30.8 pg (ref 26.0–34.0)
MCHC: 33.4 g/dL (ref 30.0–36.0)
MCV: 92.3 fL (ref 80.0–100.0)
Platelets: 175 10*3/uL (ref 150–400)
RBC: 4.93 MIL/uL (ref 4.22–5.81)
RDW: 12.3 % (ref 11.5–15.5)
WBC: 5.5 10*3/uL (ref 4.0–10.5)
nRBC: 0 % (ref 0.0–0.2)

## 2021-04-23 LAB — COMPREHENSIVE METABOLIC PANEL
ALT: 35 U/L (ref 0–44)
AST: 18 U/L (ref 15–41)
Albumin: 4.4 g/dL (ref 3.5–5.0)
Alkaline Phosphatase: 54 U/L (ref 38–126)
Anion gap: 8 (ref 5–15)
BUN: 19 mg/dL (ref 6–20)
CO2: 30 mmol/L (ref 22–32)
Calcium: 9.5 mg/dL (ref 8.9–10.3)
Chloride: 102 mmol/L (ref 98–111)
Creatinine, Ser: 1.11 mg/dL (ref 0.61–1.24)
GFR, Estimated: >60 mL/min (ref >60)
Glucose, Bld: 100 mg/dL — ABNORMAL HIGH (ref 70–99)
Potassium: 4.1 mmol/L (ref 3.5–5.1)
Sodium: 140 mmol/L (ref 135–145)
Total Bilirubin: 1.4 mg/dL — ABNORMAL HIGH (ref 0.3–1.2)
Total Protein: 6.8 g/dL (ref 6.5–8.1)

## 2021-04-23 NOTE — Patient Instructions (Signed)
Your procedure is scheduled on: Monday April 26, 2021. Report to Day Surgery inside [facility] 2nd floor. To find out your arrival time please call [phone] between 1PM - 3PM on Friday April 23, 2021.  Remember: Instructions that are not followed completely may result in serious medical risk,  up to and including death, or upon the discretion of your surgeon and anesthesiologist your  surgery may need to be rescheduled.     _X__ 1. Do not eat food or drink fluids after midnight the night before your procedure.                 No chewing gum or hard candies.   __X__2.  On the morning of surgery brush your teeth with toothpaste and water, you                may rinse your mouth with mouthwash if you wish.  Do not swallow any toothpaste or mouthwash.     _X__ 3.  No Alcohol for 24 hours before or after surgery.   _X__ 4.  Do Not Smoke or use e-cigarettes For 24 Hours Prior to Your Surgery.                 Do not use any chewable tobacco products for at least 6 hours prior to                 Surgery.  _X__  5.  Do not use any recreational drugs (marijuana, cocaine, heroin, ecstasy, MDMA or other)                For at least one week prior to your surgery.  Combination of these drugs with anesthesia                May have life threatening results.  __X__ 6.  Notify your doctor if there is any change in your medical condition      (cold, fever, infections).     Do not wear jewelry, make-up, hairpins, clips or nail polish. Do not wear lotions, powders, or perfumes. You may wear deodorant. Do not shave 48 hours prior to surgery. Men may shave face and neck. Do not bring valuables to the hospital.    [facility] is not responsible for any belongings or valuables.  Contacts, dentures or bridgework may not be worn into surgery. Leave your suitcase in the car. After surgery it may be brought to your room. For patients admitted to the hospital, discharge  time is determined by your treatment team.   Patients discharged the day of surgery will not be allowed to drive home.   Make arrangements for someone to be with you for the first 24 hours of your Same Day Discharge.  __X__ Take these medicines the morning of surgery with A SIP OF WATER:    1. None   2.   3.   4.  5.  6.  ____ Fleet Enema (as directed)   __X__ Use CHG Soap (or wipes) as directed  ____ Use Benzoyl Peroxide Gel as instructed  ____ Use inhalers on the day of surgery  ____ Stop metformin 2 days prior to surgery    ____ Take 1/2 of usual insulin dose the night before surgery. No insulin the morning          of surgery.   ____ Call your PCP, cardiologist, or Pulmonologist if taking Coumadin/Plavix/aspirin and ask when to stop before your surgery.   __X__  One Week prior to surgery- Stop Anti-inflammatories such as Ibuprofen, Aleve, Advil, Motrin, meloxicam (MOBIC), diclofenac, etodolac, ketorolac, Toradol, Daypro, piroxicam, Goody's or BC powders. OK TO USE TYLENOL IF NEEDED   __X__ Stop supplements until after surgery.    ____ Bring C-Pap to the hospital.    If you have any questions regarding your pre-procedure instructions,  Please call Pre-admit Testing at [phone]

## 2021-04-26 ENCOUNTER — Encounter: Payer: Self-pay | Admitting: Urology

## 2021-04-26 ENCOUNTER — Ambulatory Visit: Payer: BC Managed Care – PPO | Admitting: Certified Registered Nurse Anesthetist

## 2021-04-26 ENCOUNTER — Other Ambulatory Visit: Payer: Self-pay

## 2021-04-26 ENCOUNTER — Encounter
Admission: RE | Disposition: A | Discharge status: Home / Self Care | Payer: Self-pay | Source: Home / Self Care | Attending: Urology

## 2021-04-26 ENCOUNTER — Ambulatory Visit: Payer: BC Managed Care – PPO | Admitting: Urgent Care

## 2021-04-26 ENCOUNTER — Ambulatory Visit
Admission: RE | Admit: 2021-04-26 | Discharge: 2021-04-26 | Disposition: A | Discharge status: Home / Self Care | Payer: BC Managed Care – PPO | Attending: Urology | Admitting: Urology

## 2021-04-26 DIAGNOSIS — K760 Fatty (change of) liver, not elsewhere classified: Secondary | ICD-10-CM | POA: Insufficient documentation

## 2021-04-26 DIAGNOSIS — I10 Essential (primary) hypertension: Secondary | ICD-10-CM | POA: Insufficient documentation

## 2021-04-26 DIAGNOSIS — N433 Hydrocele, unspecified: Secondary | ICD-10-CM | POA: Diagnosis not present

## 2021-04-26 DIAGNOSIS — Z8616 Personal history of COVID-19: Secondary | ICD-10-CM | POA: Diagnosis not present

## 2021-04-26 HISTORY — PX: HYDROCELE EXCISION: SHX482

## 2021-04-26 SURGERY — HYDROCELECTOMY
Anesthesia: General | Laterality: Right

## 2021-04-26 MED ORDER — CHLORHEXIDINE GLUCONATE 0.12 % MT SOLN: 15.0000 mL | Freq: Once | OROMUCOSAL | Status: AC

## 2021-04-26 MED ORDER — MIDAZOLAM HCL 2 MG/2ML IJ SOLN
INTRAMUSCULAR | Status: AC
  Filled 2021-04-26: qty 2

## 2021-04-26 MED ORDER — OXYCODONE-ACETAMINOPHEN 5-325 MG PO TABS: 1.0000 | ORAL_TABLET | ORAL | 0 refills | Status: DC | PRN

## 2021-04-26 MED ORDER — DOCUSATE SODIUM 100 MG PO CAPS: 100.0000 mg | ORAL_CAPSULE | Freq: Two times a day (BID) | ORAL | 0 refills | Status: DC

## 2021-04-26 MED ORDER — FAMOTIDINE 20 MG PO TABS
ORAL_TABLET | ORAL | Status: AC
  Administered 2021-04-26: 20 mg via ORAL
  Filled 2021-04-26: qty 1

## 2021-04-26 MED ORDER — ACETAMINOPHEN 10 MG/ML IV SOLN
INTRAVENOUS | Status: DC | PRN
  Administered 2021-04-26: 1000 mg via INTRAVENOUS

## 2021-04-26 MED ORDER — 0.9 % SODIUM CHLORIDE (POUR BTL) OPTIME
TOPICAL | Status: DC | PRN
  Administered 2021-04-26: 90 mL

## 2021-04-26 MED ORDER — FAMOTIDINE 20 MG PO TABS: 20.0000 mg | ORAL_TABLET | Freq: Once | ORAL | Status: AC

## 2021-04-26 MED ORDER — BUPIVACAINE HCL (PF) 0.5 % IJ SOLN
INTRAMUSCULAR | Status: AC
  Filled 2021-04-26: qty 30

## 2021-04-26 MED ORDER — PROPOFOL 10 MG/ML IV BOLUS
INTRAVENOUS | Status: AC
  Filled 2021-04-26: qty 20

## 2021-04-26 MED ORDER — PROPOFOL 10 MG/ML IV BOLUS
INTRAVENOUS | Status: DC | PRN
  Administered 2021-04-26: 170 mg via INTRAVENOUS
  Administered 2021-04-26: 30 mg via INTRAVENOUS

## 2021-04-26 MED ORDER — ONDANSETRON HCL 4 MG/2ML IJ SOLN
INTRAMUSCULAR | Status: DC | PRN
  Administered 2021-04-26: 4 mg via INTRAVENOUS

## 2021-04-26 MED ORDER — FENTANYL CITRATE (PF) 100 MCG/2ML IJ SOLN
INTRAMUSCULAR | Status: DC | PRN
  Administered 2021-04-26: 50 ug via INTRAVENOUS
  Administered 2021-04-26 (×2): 25 ug via INTRAVENOUS

## 2021-04-26 MED ORDER — CHLORHEXIDINE GLUCONATE 0.12 % MT SOLN
OROMUCOSAL | Status: AC
  Administered 2021-04-26: 15 mL via OROMUCOSAL
  Filled 2021-04-26: qty 15

## 2021-04-26 MED ORDER — BUPIVACAINE HCL 0.5 % IJ SOLN
INTRAMUSCULAR | Status: DC | PRN
  Administered 2021-04-26: 10 mL

## 2021-04-26 MED ORDER — LACTATED RINGERS IV SOLN
INTRAVENOUS | Status: DC | PRN
Start: 2021-04-26 — End: 2021-04-26

## 2021-04-26 MED ORDER — OXYCODONE HCL 5 MG PO TABS: 5.0000 mg | ORAL_TABLET | Freq: Once | ORAL | Status: DC | PRN

## 2021-04-26 MED ORDER — CEFAZOLIN SODIUM-DEXTROSE 1-4 GM/50ML-% IV SOLN: 1.0000 g | INTRAVENOUS | Status: DC

## 2021-04-26 MED ORDER — FENTANYL CITRATE (PF) 100 MCG/2ML IJ SOLN
INTRAMUSCULAR | Status: AC
  Filled 2021-04-26: qty 2

## 2021-04-26 MED ORDER — FENTANYL CITRATE (PF) 100 MCG/2ML IJ SOLN
INTRAMUSCULAR | Status: AC
  Administered 2021-04-26: 25 ug via INTRAVENOUS
  Filled 2021-04-26: qty 2

## 2021-04-26 MED ORDER — FENTANYL CITRATE (PF) 100 MCG/2ML IJ SOLN: 25.0000 ug | INTRAMUSCULAR | Status: DC | PRN

## 2021-04-26 MED ORDER — MIDAZOLAM HCL 2 MG/2ML IJ SOLN
INTRAMUSCULAR | Status: DC | PRN
  Administered 2021-04-26 (×2): 1 mg via INTRAVENOUS

## 2021-04-26 MED ORDER — LABETALOL HCL 5 MG/ML IV SOLN
10.0000 mg | Freq: Once | INTRAVENOUS | Status: AC
  Administered 2021-04-26: 10 mg via INTRAVENOUS

## 2021-04-26 MED ORDER — CEFAZOLIN SODIUM-DEXTROSE 1-4 GM/50ML-% IV SOLN
INTRAVENOUS | Status: AC
  Filled 2021-04-26: qty 50

## 2021-04-26 MED ORDER — LIDOCAINE HCL (CARDIAC) PF 100 MG/5ML IV SOSY
PREFILLED_SYRINGE | INTRAVENOUS | Status: DC | PRN
  Administered 2021-04-26: 100 mg via INTRAVENOUS

## 2021-04-26 MED ORDER — ORAL CARE MOUTH RINSE: 15.0000 mL | Freq: Once | OROMUCOSAL | Status: AC

## 2021-04-26 MED ORDER — DEXAMETHASONE SODIUM PHOSPHATE 10 MG/ML IJ SOLN
INTRAMUSCULAR | Status: DC | PRN
  Administered 2021-04-26: 10 mg via INTRAVENOUS

## 2021-04-26 MED ORDER — ACETAMINOPHEN 10 MG/ML IV SOLN
INTRAVENOUS | Status: AC
  Filled 2021-04-26: qty 100

## 2021-04-26 MED ORDER — CEFAZOLIN SODIUM-DEXTROSE 1-4 GM/50ML-% IV SOLN
INTRAVENOUS | Status: DC | PRN
  Administered 2021-04-26: 900 g via INTRAVENOUS
  Administered 2021-04-26: 2 g via INTRAVENOUS

## 2021-04-26 MED ORDER — LACTATED RINGERS IV SOLN: INTRAVENOUS | Status: DC

## 2021-04-26 MED ORDER — OXYCODONE HCL 5 MG/5ML PO SOLN: 5.0000 mg | Freq: Once | ORAL | Status: DC | PRN

## 2021-04-26 MED ORDER — LABETALOL HCL 5 MG/ML IV SOLN
INTRAVENOUS | Status: AC
  Filled 2021-04-26: qty 4

## 2021-04-26 SURGICAL SUPPLY — 38 items
ADH SKN CLS APL DERMABOND .7 (GAUZE/BANDAGES/DRESSINGS) ×1
APL PRP STRL LF DISP 70% ISPRP (MISCELLANEOUS) ×1
BLADE CLIPPER SURG (BLADE) ×2 IMPLANT
BLADE SURG 15 STRL LF DISP TIS (BLADE) ×1 IMPLANT
BLADE SURG 15 STRL SS (BLADE) ×2
CHLORAPREP W/TINT 26 (MISCELLANEOUS) ×2 IMPLANT
DERMABOND ADVANCED (GAUZE/BANDAGES/DRESSINGS) ×1
DERMABOND ADVANCED .7 DNX12 (GAUZE/BANDAGES/DRESSINGS) ×1 IMPLANT
DRAIN PENROSE 12X.25 LTX STRL (MISCELLANEOUS) IMPLANT
DRAPE LAPAROTOMY 77X122 PED (DRAPES) ×2 IMPLANT
DRSG GAUZE FLUFF 36X18 (GAUZE/BANDAGES/DRESSINGS) ×2 IMPLANT
ELECT REM PT RETURN 9FT ADLT (ELECTROSURGICAL) ×2
ELECTRODE REM PT RTRN 9FT ADLT (ELECTROSURGICAL) ×1 IMPLANT
GAUZE 4X4 16PLY ~~LOC~~+RFID DBL (SPONGE) ×2 IMPLANT
GAUZE SPONGE 4X4 12PLY STRL (GAUZE/BANDAGES/DRESSINGS) IMPLANT
GLOVE SURG ENC MOIS LTX SZ6.5 (GLOVE) ×2 IMPLANT
GOWN STRL REUS W/ TWL LRG LVL3 (GOWN DISPOSABLE) ×2 IMPLANT
GOWN STRL REUS W/TWL LRG LVL3 (GOWN DISPOSABLE) ×4
KIT TURNOVER KIT A (KITS) ×2 IMPLANT
LABEL OR SOLS (LABEL) ×2 IMPLANT
MANIFOLD NEPTUNE II (INSTRUMENTS) ×2 IMPLANT
NDL HYPO 25X1 1.5 SAFETY (NEEDLE) ×1 IMPLANT
NEEDLE HYPO 25X1 1.5 SAFETY (NEEDLE) ×2 IMPLANT
NS IRRIG 500ML POUR BTL (IV SOLUTION) ×2 IMPLANT
PACK BASIN MINOR ARMC (MISCELLANEOUS) ×2 IMPLANT
SUPPORETR ATHLETIC LG (MISCELLANEOUS) ×1 IMPLANT
SUPPORTER ATHLETIC LG (MISCELLANEOUS) ×2
SUT CHROMIC 3 0 PS 2 (SUTURE) ×4 IMPLANT
SUT CHROMIC 3 0 SH 27 (SUTURE) IMPLANT
SUT ETHILON 3-0 FS-10 30 BLK (SUTURE)
SUT ETHILON NAB PS2 4-0 18IN (SUTURE) IMPLANT
SUT VIC AB 3-0 SH 27 (SUTURE) ×4
SUT VIC AB 3-0 SH 27X BRD (SUTURE) ×1 IMPLANT
SUT VIC AB 4-0 SH 27 (SUTURE)
SUT VIC AB 4-0 SH 27XANBCTRL (SUTURE) IMPLANT
SUTURE EHLN 3-0 FS-10 30 BLK (SUTURE) IMPLANT
SYR 10ML LL (SYRINGE) ×2 IMPLANT
WATER STERILE IRR 500ML POUR (IV SOLUTION) ×2 IMPLANT

## 2021-04-26 NOTE — Anesthesia Preprocedure Evaluation (Addendum)
Anesthesia Evaluation  Patient identified by MRN, date of birth, ID band Patient awake    Reviewed: Allergy & Precautions, H&P , NPO status , Patient's Chart, lab work & pertinent test results  History of Anesthesia Complications Negative for: history of anesthetic complications  Airway Mallampati: II  TM Distance: >3 FB Neck ROM: full    Dental  (+) Teeth Intact   Pulmonary neg pulmonary ROS, neg sleep apnea, neg COPD,    Pulmonary exam normal        Cardiovascular hypertension, (-) angina(-) Past MI and (-) Cardiac Stents Normal cardiovascular exam(-) dysrhythmias      Neuro/Psych negative neurological ROS  negative psych ROS   GI/Hepatic negative GI ROS, NAFLD   Endo/Other  negative endocrine ROS  Renal/GU      Musculoskeletal   Abdominal   Peds  Hematology negative hematology ROS (+)   Anesthesia Other Findings Past Medical History: No date: Allergic rhinitis due to pollen No date: COVID-19     Comment:  01/06/20 +  No date: Fatty liver No date: HTN (hypertension) No date: Hyperlipidemia No date: ITP (idiopathic thrombocytopenic purpura)     Comment:  off prednisone since 02/2010, ?etiology per pt 2/2               antibiotics   Past Surgical History: No date: NASAL SINUS SURGERY  BMI    Body Mass Index: 29.16 kg/m      Reproductive/Obstetrics negative OB ROS                            Anesthesia Physical Anesthesia Plan  ASA: 2  Anesthesia Plan: General LMA   Post-op Pain Management:    Induction: Intravenous  PONV Risk Score and Plan: Dexamethasone, Ondansetron, Midazolam and Treatment may vary due to age or medical condition  Airway Management Planned:   Additional Equipment:   Intra-op Plan:   Post-operative Plan:   Informed Consent: I have reviewed the patients History and Physical, chart, labs and discussed the procedure including the risks, benefits  and alternatives for the proposed anesthesia with the patient or authorized representative who has indicated his/her understanding and acceptance.     Dental Advisory Given  Plan Discussed with: Anesthesiologist, CRNA and Surgeon  Anesthesia Plan Comments:         Anesthesia Quick Evaluation

## 2021-04-26 NOTE — Anesthesia Postprocedure Evaluation (Signed)
Anesthesia Post Note  Patient: [facility]  Procedure(s) Performed: HYDROCELECTOMY ADULT (Right)  Patient location during evaluation: PACU Anesthesia Type: General Level of consciousness: awake and alert Pain management: pain level controlled Vital Signs Assessment: post-procedure vital signs reviewed and stable Respiratory status: spontaneous breathing, nonlabored ventilation, respiratory function stable and patient connected to nasal cannula oxygen Cardiovascular status: blood pressure returned to baseline and stable Postop Assessment: no apparent nausea or vomiting Anesthetic complications: no   No notable events documented.   Last Vitals:  Vitals:   04/26/21 1159 04/26/21 1206  BP: (!) 150/102 (!) 153/98  Pulse: (!) 51 65  Resp: 10 14  Temp: (!) 36.1 C 36.6 C  SpO2: 96% 98%    Last Pain:  Vitals:   04/26/21 1206  TempSrc: Temporal  PainSc: 5                  Karleen Hampshire

## 2021-04-26 NOTE — Interval H&P Note (Signed)
History and Physical Interval Note:  04/26/2021 9:59 AM  Jonathan Blackwell  has presented today for surgery, with the diagnosis of Right Hydrocele.  The various methods of treatment have been discussed with the patient and family. After consideration of risks, benefits and other options for treatment, the patient has consented to  Procedure(s): HYDROCELECTOMY ADULT (Right) as a surgical intervention.  The patient's history has been reviewed, patient examined, no change in status, stable for surgery.  I have reviewed the patient's chart and labs.  Questions were answered to the patient's satisfaction.    RRR CTAB  Jonathan Blackwell

## 2021-04-26 NOTE — Transfer of Care (Signed)
Immediate Anesthesia Transfer of Care Note  Patient: Jonathan Blackwell [facility]  Procedure(s) Performed: HYDROCELECTOMY ADULT (Right)  Patient Location: PACU  Anesthesia Type:General  Level of Consciousness: awake, alert  and oriented  Airway & Oxygen Therapy: Patient Spontanous Breathing  Post-op Assessment: Report given to RN  Post vital signs: Reviewed and stable  Last Vitals:  Vitals Value Taken Time  BP 155/95 04/26/21 1118  Temp    Pulse 77 04/26/21 1120  Resp 15 04/26/21 1120  SpO2 98 % 04/26/21 1120  Vitals shown include unvalidated device data.  Last Pain:  Vitals:   04/26/21 0802  TempSrc: Oral  PainSc: 0-No pain         Complications: No notable events documented.

## 2021-04-26 NOTE — Discharge Instructions (Addendum)
   AMBULATORY SURGERY  DISCHARGE INSTRUCTIONS   The drugs that you were given will stay in your system until tomorrow so for the next 24 hours you should not:  Drive an automobile Make any legal decisions Drink any alcoholic beverage   You may resume regular meals tomorrow.  Today it is better to start with liquids and gradually work up to solid foods.  You may eat anything you prefer, but it is better to start with liquids, then soup and crackers, and gradually work up to solid foods.   Please notify your doctor immediately if you have any unusual bleeding, trouble breathing, redness and pain at the surgery site, drainage, fever, or pain not relieved by medication.     Your post-operative visit with Dr.                                       is: Date:                        Time:    Please call to schedule your post-operative visit.  Additional Instructions:      TYLENOL 1,000 MG GIVEN AT THE HOSPITAL AT 10:45 AM.

## 2021-04-26 NOTE — Op Note (Signed)
Date of procedure: 04/26/21  Preoperative diagnosis:  Right hydrocele  Postoperative diagnosis:  Same as above  Procedure: Right hydrocelectomy  Surgeon: Vanna Scotland, MD  Anesthesia: General  Complications: None  Intraoperative findings: 160 cc of clear straw fluid drained from right hydrocele sac.  Uncomplicated procedure.  EBL: Minimal  Specimens: None  Drains: None  Indication: Jonathan Blackwell is a 60 y.o. patient with symptomatic right hydrocele.  After reviewing the management options for treatment, he elected to proceed with the above surgical procedure(s). We have discussed the potential benefits and risks of the procedure, side effects of the proposed treatment, the likelihood of the patient achieving the goals of the procedure, and any potential problems that might occur during the procedure or recuperation. Informed consent has been obtained.  Description of procedure:  The patient was taken to the operating room and general anesthesia was induced.  The patient was placed in the supine position, prepped and draped in the usual sterile fashion, and preoperative antibiotics were administered. A preoperative time-out was performed.   Approximate 4 cm incision was created along skin lines across the right hemiscrotum.  The dissection was carried down through the subcutaneous tissues using Bovie electrocautery.  The hydrocele sac was then delivered through the wound and additional layers were removed bluntly.  The hydrocele sac was then punctured using a knife and 160 cc of straw-colored fluid was drained.  The edges of the hydrocele sac were then excised using Bovie electrocautery.  These were not sent to pathology as there was no concerning findings.  Careful hemostasis was then achieved using Bovie electrocautery.  I then everted the cut sac edges and oversewed using a 3-0 Vicryl suture using the Jabaley technique.  Care was taken to avoid constricting the cord at the  proximal aspect.  The scrotum was then irrigated.  The right testicle was then replaced back in the scrotal sac in the normal anatomic position with care to ensure that the lateral sulcus was oriented in the lateral direction.  The surgical site was then closed using a running 3-0 Vicryl suture.  Simple interrupted chromic's were used on the skin.  The patient was then cleaned and dried and additional Dermabond was applied.  Fluffs and a scrotal support device were also applied.  He was then reversed of anesthesia and taken to PACU in stable condition.  Plan: Wound check in 4 weeks.  Vanna Scotland, M.D.

## 2021-04-26 NOTE — Anesthesia Procedure Notes (Signed)
Procedure Name: LMA Insertion Date/Time: 04/26/2021 10:38 AM Performed by: Chelsea Aus, CRNA Pre-anesthesia Checklist: Patient identified, Emergency Drugs available, Suction available and Patient being monitored Patient Re-evaluated:Patient Re-evaluated prior to induction Oxygen Delivery Method: Circle system utilized Preoxygenation: Pre-oxygenation with 100% oxygen Induction Type: IV induction LMA: LMA inserted LMA Size: 4.0 Number of attempts: 2 (initially placed airQ 4.5, large leak. changed to lma 4) Placement Confirmation: positive ETCO2 and breath sounds checked- equal and bilateral Tube secured with: Tape Dental Injury: Teeth and Oropharynx as per pre-operative assessment

## 2021-04-27 ENCOUNTER — Encounter: Payer: Self-pay | Admitting: Urology

## 2021-04-28 NOTE — Addendum Note (Signed)
Addendum  created 04/28/21 1132 by Stormy Fabian, CRNA   Intraprocedure Meds edited

## 2021-05-06 ENCOUNTER — Telehealth: Payer: Self-pay

## 2021-05-06 NOTE — Telephone Encounter (Signed)
Patient called post hydrocelectomy (04-26-21) stating that he noticed some light pink clear fluid drainage in his underwear today from incision site. Patient denies fever, chills, nausea, no site redness or pain, no puss like discharge noted. Patient was reassured that drainage can still happen at this time post op and to monitor for now. If site becomes red/swollen or puss is noted or fever develops he is to call for further evaluation. If drainage worsens he is to call for a same day PA appointment for wound check, apt for today was offered patient will wait for now.

## 2021-05-11 ENCOUNTER — Encounter: Payer: BC Managed Care – PPO | Admitting: Internal Medicine

## 2021-05-21 ENCOUNTER — Ambulatory Visit (INDEPENDENT_AMBULATORY_CARE_PROVIDER_SITE_OTHER): Payer: BC Managed Care – PPO | Admitting: Urology

## 2021-05-21 ENCOUNTER — Other Ambulatory Visit: Payer: Self-pay

## 2021-05-21 ENCOUNTER — Encounter: Payer: Self-pay | Admitting: Urology

## 2021-05-21 VITALS — BP 162/80 | HR 65 | Ht 72.0 in | Wt 220.0 lb

## 2021-05-21 DIAGNOSIS — N50819 Testicular pain, unspecified: Secondary | ICD-10-CM

## 2021-05-21 LAB — MICROSCOPIC EXAMINATION
Bacteria, UA: NONE SEEN
Epithelial Cells (non renal): NONE SEEN /hpf (ref 0–10)
RBC, Urine: NONE SEEN /hpf (ref 0–2)
WBC, UA: NONE SEEN /hpf (ref 0–5)

## 2021-05-21 LAB — URINALYSIS, COMPLETE
Bilirubin, UA: NEGATIVE
Glucose, UA: NEGATIVE
Ketones, UA: NEGATIVE
Leukocytes,UA: NEGATIVE
Nitrite, UA: NEGATIVE
Protein,UA: NEGATIVE
RBC, UA: NEGATIVE
Specific Gravity, UA: 1.01 (ref 1.005–1.030)
Urobilinogen, Ur: 0.2 mg/dL (ref 0.2–1.0)
pH, UA: 7 (ref 5.0–7.5)

## 2021-05-21 NOTE — Progress Notes (Signed)
05/21/2021 11:23 AM   Tiffany Talarico Schrieber 12/20/1960 [id number]  Referring provider: McLean-Scocuzza, Pasty Spillers, MD [address] [place],  [place] [address]  Chief Complaint  Patient presents with   Follow-up   Testicle Injury   Urological history 1. Hydrocele -s/p right hydrocelectomy 04/26/2020  HPI: Jonathan Blackwell is a 60 y.o. male who presents today for testicular swelling and lower abdominal discomfort.  He underwent a right hydrocelectomy on April 26, 2021 with Dr. Apolinar Junes.  160 cc of clear straw fluid was drained from the right hydrocele.  It was an uncomplicated procedure.  UA negative.  He states that after undergoing the hydrocelectomy he would pretty much be back to normal and the swelling with have abated.  He states the swelling is still present and his scrotum is hard.  He denies any intense scrotal pain or any discharge or drainage from the scrotal area.  He states the incision is healed.  Patient denies any modifying or aggravating factors.  Patient denies any gross hematuria, dysuria or suprapubic/flank pain.  Patient denies any fevers, chills, nausea or vomiting.    He has been wearing an athletic supporter and he is almost back to normal activity.  PMH: Past Medical History:  Diagnosis Date   Allergic rhinitis due to pollen    COVID-19    01/06/20 +    Fatty liver    HTN (hypertension)    Hyperlipidemia    ITP (idiopathic thrombocytopenic purpura)    off prednisone since 02/2010, ?etiology per pt 2/2 antibiotics     Surgical History: Past Surgical History:  Procedure Laterality Date   HYDROCELE EXCISION Right 04/26/2021   Procedure: HYDROCELECTOMY ADULT;  Surgeon: Vanna Scotland, MD;  Location: [facility] ORS;  Service: Urology;  Laterality: Right;   NASAL SINUS SURGERY      Home Medications:  Allergies as of 05/21/2021       Reactions   Hydrocodone-chlorpheniramine Itching        Medication List        Accurate as of May 21, 2021  11:23 AM. If you have any questions, ask your nurse or doctor.          STOP taking these medications    docusate sodium 100 MG capsule Commonly known as: COLACE Stopped by: Jennett Tarbell, PA-C   fluticasone 50 MCG/ACT nasal spray Commonly known as: FLONASE Stopped by: Ayodeji Keimig, PA-C   oxyCODONE-acetaminophen 5-325 MG tablet Commonly known as: Percocet Stopped by: Michiel Cowboy, PA-C       TAKE these medications    losartan-hydrochlorothiazide 100-25 MG tablet Commonly known as: HYZAAR TAKE 1 TABLET BY MOUTH DAILY IN THE MORNING   MULTIVITAMIN ADULT PO Take by mouth.   naproxen sodium 220 MG tablet Commonly known as: ALEVE Take 220 mg by mouth daily as needed (pain).        Allergies:  Allergies  Allergen Reactions   Hydrocodone-Chlorpheniramine Itching    Family History: Family History  Problem Relation Age of Onset   Arthritis Other    Hypertension Father     Social History:  reports that he has never smoked. He has never used smokeless tobacco. He reports current alcohol use. He reports that he does not use drugs.  ROS: Pertinent ROS in HPI  Physical Exam: BP (!) 162/80   Pulse 65   Ht 6' (1.829 m)   Wt 220 lb (99.8 kg)   BMI 29.84 kg/m   Constitutional:  Well nourished. Alert and oriented, No  acute distress. HEENT: [place] AT, mask in place.  Trachea midline Cardiovascular: No clubbing, cyanosis, or edema. Respiratory: Normal respiratory effort, no increased work of breathing. GU: No CVA tenderness.  No bladder fullness or masses.  Patient with normal phallus. Scrotum without lesions, cysts, rashes and/or edema.  Right hemiscrotum is enlarged without erythema, crepitus, fluctuance, extreme tenderness or drainage appreciated.  Left testicle is located scrotally bilaterally.  No masses in the left testicles. Left epididymis are normal. Neurologic: Grossly intact, no focal deficits, moving all 4 extremities. Psychiatric: Normal mood and  affect.  Laboratory Data: Lab Results  Component Value Date   WBC 5.5 04/23/2021   HGB 15.2 04/23/2021   HCT 45.5 04/23/2021   MCV 92.3 04/23/2021   PLT 175 04/23/2021    Lab Results  Component Value Date   CREATININE 1.11 04/23/2021    Lab Results  Component Value Date   PSA 0.71 04/13/2020   PSA 0.61 09/28/2017   PSA 0.51 04/01/2013    Lab Results  Component Value Date   HGBA1C 5.7 11/18/2020       Component Value Date/Time   CHOL 191 11/18/2020 1016   HDL 45.00 11/18/2020 1016   CHOLHDL 4 11/18/2020 1016   VLDL 18.2 11/18/2020 1016   LDLCALC 128 (H) 11/18/2020 1016    Lab Results  Component Value Date   AST 18 04/23/2021   Lab Results  Component Value Date   ALT 35 04/23/2021    Urinalysis Component     Latest Ref Rng & Units 05/21/2021  Specific Gravity, UA     1.005 - 1.030 1.010  pH, UA     5.0 - 7.5 7.0  Color, UA     Yellow Yellow  Appearance Ur     Clear Clear  Leukocytes,UA     Negative Negative  Protein,UA     Negative/Trace Negative  Glucose, UA     Negative Negative  Ketones, UA     Negative Negative  RBC, UA     Negative Negative  Bilirubin, UA     Negative Negative  Urobilinogen, Ur     0.2 - 1.0 mg/dL 0.2  Nitrite, UA     Negative Negative  Microscopic Examination      See below:   Component     Latest Ref Rng & Units 05/21/2021  WBC, UA     0 - 5 /hpf None seen  RBC     0 - 2 /hpf None seen  Epithelial Cells (non renal)     0 - 10 /hpf None seen  Bacteria, UA     None seen/Few None seen  I have reviewed the labs.   Pertinent Imaging: N/A  Assessment & Plan:    1. Pain in testicle, unspecified laterality -Reassured patient that he is experiencing normal postoperative symptoms after hydrocelectomy -Encourage continued athletic supporter use -Reviewed red flag signs   Return in about 1 month (around 06/21/2021) for recheck .  These notes generated with voice recognition software. I apologize for  typographical errors.  Zara Council, PA-C  [facility] [address]  [place], [place] [address] [phone]

## 2021-05-26 ENCOUNTER — Ambulatory Visit: Payer: BC Managed Care – PPO | Admitting: Urology

## 2021-06-03 ENCOUNTER — Encounter: Payer: Self-pay | Admitting: Adult Health

## 2021-06-03 ENCOUNTER — Telehealth (INDEPENDENT_AMBULATORY_CARE_PROVIDER_SITE_OTHER): Payer: BC Managed Care – PPO | Admitting: Adult Health

## 2021-06-03 VITALS — Temp 97.9°F | Ht 72.01 in | Wt 220.0 lb

## 2021-06-03 DIAGNOSIS — U071 COVID-19: Secondary | ICD-10-CM | POA: Diagnosis not present

## 2021-06-03 DIAGNOSIS — R051 Acute cough: Secondary | ICD-10-CM

## 2021-06-03 MED ORDER — MOLNUPIRAVIR EUA 200MG CAPSULE: 4.0000 | ORAL_CAPSULE | Freq: Two times a day (BID) | ORAL | 0 refills | Status: AC

## 2021-06-03 MED ORDER — BENZONATATE 100 MG PO CAPS: 100.0000 mg | ORAL_CAPSULE | Freq: Three times a day (TID) | ORAL | 0 refills | Status: DC | PRN

## 2021-06-03 NOTE — Patient Instructions (Addendum)
Benzonatate Capsules What is this medication? BENZONATATE (ben ZOE na tate) is used to relieve cough. It works by calming your cough reflex. It belongs to a group of medications called cough suppressants. This medicine may be used for other purposes; ask your health care provider or pharmacist if you have questions. COMMON BRAND NAME(S): Tessalon Perles, Zonatuss What should I tell my care team before I take this medication? They need to know if you have any of these conditions: Kidney or liver disease An unusual or allergic reaction to benzonatate, anesthetics, other medications, foods, dyes, or preservatives Pregnant or trying to get pregnant Breast-feeding How should I use this medication? Take this medication by mouth with a glass of water. Follow the directions on the prescription label. Avoid breaking, chewing, or sucking the capsule, as this can cause serious side effects. Take your medication at regular intervals. Do not take your medication more often than directed. Talk to your care team about the use of this medication in children. While this medication may be prescribed for children as young as 43 years old for selected conditions, precautions do apply. Overdosage: If you think you have taken too much of this medicine contact a poison control center or emergency room at once. NOTE: This medicine is only for you. Do not share this medicine with others. What if I miss a dose? If you miss a dose, take it as soon as you can. If it is almost time for your next dose, take only that dose. Do not take double or extra doses. What may interact with this medication? Do not take this medication with any of the following: MAOIs like Carbex, Eldepryl, Marplan, Nardil, and Parnate This list may not describe all possible interactions. Give your health care provider a list of all the medicines, herbs, non-prescription drugs, or dietary supplements you use. Also tell them if you smoke, drink  alcohol, or use illegal drugs. Some items may interact with your medicine. What should I watch for while using this medication? Tell your care team if your symptoms do not improve or if they get worse. If you have a high fever, skin rash, or headache, see your care team. You may get drowsy or dizzy. Do not drive, use machinery, or do anything that needs mental alertness until you know how this medication affects you. Do not sit or stand up quickly, especially if you are an older patient. This reduces the risk of dizzy or fainting spells. What side effects may I notice from receiving this medication? Side effects that you should report to your care team as soon as possible: Allergic reactions--skin rash, itching, hives, swelling of the face, lips, tongue, or throat Confusion Hallucinations Side effects that usually do not require medical attention (report to your care team if they continue or are bothersome): Burning or tingling of the tongue, mouth, throat, or face Dizziness Drowsiness This list may not describe all possible side effects. Call your doctor for medical advice about side effects. You may report side effects to FDA at 1-800-FDA-1088. Where should I keep my medication? Keep out of the reach of children. Store at room temperature between 15 and 30 degrees C (59 and 86 degrees F). Keep tightly closed. Protect from light and moisture. Throw away any unused medication after the expiration date. NOTE: This sheet is a summary. It may not cover all possible information. If you have questions about this medicine, talk to your doctor, pharmacist, or health care provider.  2022 Elsevier/Gold  Standard (2020-09-16 00:00:00) Molnupiravir Oral Capsules What is this medication? MOLNUPIRAVIR (mol nue pir a vir) treats COVID-19. It is an antiviral medication. It may decrease the risk of developing severe symptoms of COVID-19. It may also decrease the chance of going to the hospital. This medication  is not approved by the FDA. The FDA has authorized emergency use of this medication during the COVID-19 pandemic. This medicine may be used for other purposes; ask your health care provider or pharmacist if you have questions. COMMON BRAND NAME(S): LAGEVRIO What should I tell my care team before I take this medication? They need to know if you have any of these conditions: Any allergies Any serious illness An unusual or allergic reaction to molnupiravir, other medications, foods, dyes, or preservatives Pregnant or trying to get pregnant Breast-feeding How should I use this medication? Take this medication by mouth with water. Take it as directed on the prescription label at the same time every day. Do not cut, crush or chew this medication. Swallow the capsules whole. You can take it with or without food. If it upsets your stomach, take it with food. Take all of this medication unless your care team tells you to stop it early. Keep taking it even if you think you are better. Talk to your care team about the use of this medication in children. Special care may be needed. Overdosage: If you think you have taken too much of this medicine contact a poison control center or emergency room at once. NOTE: This medicine is only for you. Do not share this medicine with others. What if I miss a dose? If you miss a dose, take it as soon as you can unless it is more than 10 hours late. If it is more than 10 hours late, skip the missed dose. Take the next dose at the normal time. Do not take extra or 2 doses at the same time to make up for the missed dose. What may interact with this medication? Interactions have not been studied. This list may not describe all possible interactions. Give your health care provider a list of all the medicines, herbs, non-prescription drugs, or dietary supplements you use. Also tell them if you smoke, drink alcohol, or use illegal drugs. Some items may interact with your  medicine. What should I watch for while using this medication? Your condition will be monitored carefully while you are receiving this medication. Visit your care team for regular checkups. Tell your care team if your symptoms do not start to get better or if they get worse. Do not become pregnant while taking this medication. You may need a pregnancy test before starting this medication. Women must use a reliable form of birth control while taking this medication and for 4 days after stopping the medication. Women should inform their care team if they wish to become pregnant or think they might be pregnant. Men should not father a child while taking this medication and for 3 months after stopping it. There is potential for serious harm to an unborn child. Talk to your care team for more information. Do not breast-feed an infant while taking this medication and for 4 days after stopping the medication. What side effects may I notice from receiving this medication? Side effects that you should report to your care team as soon as possible: Allergic reactions--skin rash, itching, hives, swelling of the face, lips, tongue, or throat Side effects that usually do not require medical attention (report these to your  care team if they continue or are bothersome): Diarrhea Dizziness Nausea This list may not describe all possible side effects. Call your doctor for medical advice about side effects. You may report side effects to FDA at 1-800-FDA-1088. Where should I keep my medication? Keep out of the reach of children and pets. Store at room temperature between 20 and 25 degrees C (68 and 77 degrees F). Get rid of any unused medication after the expiration date. To get rid of medications that are no longer needed or have expired: Take the medication to a medication take-back program. Check with your pharmacy or law enforcement to find a location. If you cannot return the medication, check the label or package  insert to see if the medication should be thrown out in the garbage or flushed down the toilet. If you are not sure, ask your care team. If it is safe to put it in the trash, take the medication out of the container. Mix the medication with cat litter, dirt, coffee grounds, or other unwanted substance. Seal the mixture in a bag or container. Put it in the trash. NOTE: This sheet is a summary. It may not cover all possible information. If you have questions about this medicine, talk to your doctor, pharmacist, or health care provider.  2022 Elsevier/Gold Standard (2020-06-15 00:00:00) Cough, Adult A cough helps to clear your throat and lungs. A cough may be a sign of an illness or another medical condition. An acute cough may only last 2-3 weeks, while a chronic cough may last 8 or more weeks. Many things can cause a cough. They include: Germs (viruses or bacteria) that attack the airway. Breathing in things that bother (irritate) your lungs. Allergies. Asthma. Mucus that runs down the back of your throat (postnasal drip). Smoking. Acid backing up from the stomach into the tube that moves food from the mouth to the stomach (gastroesophageal reflux). Some medicines. Lung problems. Other medical conditions, such as heart failure or a blood clot in the lung (pulmonary embolism). Follow these instructions at home: Medicines Take over-the-counter and prescription medicines only as told by your doctor. Talk with your doctor before you take medicines that stop a cough (cough suppressants). Lifestyle  Do not smoke, and try not to be around smoke. Do not use any products that contain nicotine or tobacco, such as cigarettes, e-cigarettes, and chewing tobacco. If you need help quitting, ask your doctor. Drink enough fluid to keep your pee (urine) pale yellow. Avoid caffeine. Do not drink alcohol if your doctor tells you not to drink. General instructions  Watch for any changes in your cough. Tell  your doctor about them. Always cover your mouth when you cough. Stay away from things that make you cough, such as perfume, candles, campfire smoke, or cleaning products. If the air is dry, use a cool mist vaporizer or humidifier in your home. If your cough is worse at night, try using extra pillows to raise your head up higher while you sleep. Rest as needed. Keep all follow-up visits as told by your doctor. This is important. Contact a doctor if: You have new symptoms. You cough up pus. Your cough does not get better after 2-3 weeks, or your cough gets worse. Cough medicine does not help your cough and you are not sleeping well. You have pain that gets worse or pain that is not helped with medicine. You have a fever. You are losing weight and you do not know why. You have night sweats.  Get help right away if: You cough up blood. You have trouble breathing. Your heartbeat is very fast. These symptoms may be an emergency. Do not wait to see if the symptoms will go away. Get medical help right away. Call your local emergency services (911 in the [place] Do not drive yourself to the hospital. Summary A cough helps to clear your throat and lungs. Many things can cause a cough. Take over-the-counter and prescription medicines only as told by your doctor. Always cover your mouth when you cough. Contact a doctor if you have new symptoms or you have a cough that does not get better or gets worse. This information is not intended to replace advice given to you by your health care provider. Make sure you discuss any questions you have with your health care provider. Document Revised: 07/26/2019 Document Reviewed: 06/25/2018 Elsevier Patient Education  2022 Elsevier Inc. 10 Things You Can Do to Manage Your COVID-19 Symptoms at Home If you have possible or confirmed COVID-19 Stay home except to get medical care. Monitor your symptoms carefully. If your symptoms get worse, call your healthcare  provider immediately. Get rest and stay hydrated. If you have a medical appointment, call the healthcare provider ahead of time and tell them that you have or may have COVID-19. For medical emergencies, call 911 and notify the dispatch personnel that you have or may have COVID-19. Cover your cough and sneezes with a tissue or use the inside of your elbow. Wash your hands often with soap and water for at least 20 seconds or clean your hands with an alcohol-based hand sanitizer that contains at least 60% alcohol. As much as possible, stay in a specific room and away from other people in your home. Also, you should use a separate bathroom, if available. If you need to be around other people in or outside of the home, wear a mask. Avoid sharing personal items with other people in your household, like dishes, towels, and bedding. Clean all surfaces that are touched often, like counters, tabletops, and doorknobs. Use household cleaning sprays or wipes according to the label instructions. [email or web address] 01/03/2020 This information is not intended to replace advice given to you by your health care provider. Make sure you discuss any questions you have with your health care provider. Document Revised: 02/26/2021 Document Reviewed: 02/26/2021 Elsevier Patient Education  2022 [employer].

## 2021-06-03 NOTE — Progress Notes (Signed)
Virtual Visit via Video Note  I connected with Jonathan Blackwell on 06/03/21 at  7:30 AM EST by a video enabled telemedicine application and verified that I am speaking with the correct person using two identifiers.  Location: Patient: at home  Provider: Provider: Provider's office at  [facility], [place].      I discussed the limitations of evaluation and management by telemedicine and the availability of in person appointments. The patient expressed understanding and agreed to proceed.  History of Present Illness:   onset 06/01/21 and had dry nose, cough , fever, scratchy throat, temperature 97.9- 100.2. 97.9 today.  Denies any chest congestion. Denies any pain.  No other known contacts.   Patient  denies any body aches,chills, rash, chest pain, shortness of breath, nausea, vomiting, or diarrhea.   Observations/Objective:   Patient is alert and oriented and responsive to questions Engages in conversation with provider. Speaks in full sentences without any pauses without any shortness of breath or distress.   Assessment and Plan:  COVID-19 - Plan: molnupiravir EUA (LAGEVRIO) 200 mg CAPS capsule  Acute cough - Plan: benzonatate (TESSALON) 100 MG capsule  He would like antiviral medication as above, he lives with his 48 year old father and has a wedding upcoming in 2 weeks. Discussed risks and side effects of all medications given.  Advised plain mucinex over the counter and nasal saline all per package instructions.   Follow Up Instructions: After visit summary given via MYCHART.   Advised in person evaluation at anytime is advised if any symptoms do not improve, worsen or change at any given time.  Red Flags discussed. The patient was given clear instructions to go to ER or return to medical center if any red flags develop, symptoms do not improve, worsen or new problems develop. They verbalized understanding.  I discussed the assessment and  treatment plan with the patient. The patient was provided an opportunity to ask questions and all were answered. The patient agreed with the plan and demonstrated an understanding of the instructions.   The patient was advised to call back or seek an in-person evaluation if the symptoms worsen or if the condition fails to improve as anticipated.   Jairo Ben, FNP

## 2021-06-08 NOTE — Progress Notes (Signed)
06/09/21 2:03 PM   Jonathan Blackwell 1961/06/10 [id number]  Referring provider:  McLean-Scocuzza, Pasty Spillers, MD [address] [place],  [place] [address] Chief Complaint  Patient presents with   Routine Post Op     HPI: Jonathan Blackwell is a 60 y.o.male with a personal history of hydrocele and pain in testicle, who presents today for 4 week post-op follow-up.   He is s/p right hydrocelectomy on 04/26/2021. Intraoperative findings showed 160 cc of clear straw fluid drained from right hydrocele sac. Uncomplicated procedure.  He was seen in clinic on 05/21/2021 by Michiel Cowboy, PA-C with pain in testicle. He was reassured that this was normal postoperative symptoms.     He is doing well today. He reports that he has felt the hydrocele again, he originally thought the hydrocele had returned.  He also has a small stitch which has been irritating him extruding from the skin, catching on his underwear which he like to address today.  PMH: Past Medical History:  Diagnosis Date   Allergic rhinitis due to pollen    COVID-19    01/06/20 +    Fatty liver    HTN (hypertension)    Hyperlipidemia    ITP (idiopathic thrombocytopenic purpura)    off prednisone since 02/2010, ?etiology per pt 2/2 antibiotics     Surgical History: Past Surgical History:  Procedure Laterality Date   HYDROCELE EXCISION Right 04/26/2021   Procedure: HYDROCELECTOMY ADULT;  Surgeon: Vanna Scotland, MD;  Location: [facility] ORS;  Service: Urology;  Laterality: Right;   NASAL SINUS SURGERY      Home Medications:  Allergies as of 06/09/2021       Reactions   Hydrocodone-chlorpheniramine Itching        Medication List        Accurate as of June 09, 2021  2:03 PM. If you have any questions, ask your nurse or doctor.          STOP taking these medications    benzonatate 100 MG capsule Commonly known as: TESSALON Stopped by: Vanna Scotland, MD   naproxen sodium 220 MG tablet Commonly known as:  ALEVE Stopped by: Vanna Scotland, MD       TAKE these medications    losartan-hydrochlorothiazide 100-25 MG tablet Commonly known as: HYZAAR TAKE 1 TABLET BY MOUTH DAILY IN THE MORNING   MULTIVITAMIN ADULT PO Take by mouth.        Allergies:  Allergies  Allergen Reactions   Hydrocodone-Chlorpheniramine Itching    Family History: Family History  Problem Relation Age of Onset   Arthritis Other    Hypertension Father     Social History:  reports that he has never smoked. He has never used smokeless tobacco. He reports current alcohol use. He reports that he does not use drugs.   Physical Exam: BP 140/82    Pulse 64    Ht 6' (1.829 m)    Wt 220 lb (99.8 kg)    BMI 29.84 kg/m   Constitutional:  Alert and oriented, No acute distress. HEENT: [place] AT, moist mucus membranes.  Trachea midline, no masses. Cardiovascular: No clubbing, cyanosis, or edema. Respiratory: Normal respiratory effort, no increased work of breathing. GU: Small Vicryl stitch partially protruding from incision which was grasped and removed . Enlarged softball size  right scrotum possibly consistent with hematoma of the cord versus a recurrent hydrocele. Skin: No rashes, bruises or suspicious lesions. Neurologic: Grossly intact, no focal deficits, moving all 4 extremities. Psychiatric: Normal mood  and affect.    Assessment & Plan:    Right scrotal swelling  - S/p hydrocelectomy  - He is healing well  - Discussed the pathophysiology of hydrocele. Discussed that what he feels could be a recurrence of a hydrocele versus s/p swelling and or hematoma.  we will recheck this in 3 months to determine if the hydrocele is recurrent  Discussed if hydrocelectomy has failed could repeat surgery with lower chance or success.  - Recommend supportive treatment such as compression underwear.   Return in 3 months for symptom recheck   I,Kailey Littlejohn,acting as a scribe for Vanna Scotland, MD.,have documented all  relevant documentation on the behalf of Vanna Scotland, MD,as directed by  Vanna Scotland, MD while in the presence of Vanna Scotland, MD.  I have reviewed the above documentation for accuracy and completeness, and I agree with the above.   Vanna Scotland, MD    [facility] [address] [place], [place] [address] [phone]

## 2021-06-09 ENCOUNTER — Other Ambulatory Visit: Payer: Self-pay

## 2021-06-09 ENCOUNTER — Ambulatory Visit (INDEPENDENT_AMBULATORY_CARE_PROVIDER_SITE_OTHER): Payer: BC Managed Care – PPO | Admitting: Urology

## 2021-06-09 ENCOUNTER — Encounter: Payer: Self-pay | Admitting: Urology

## 2021-06-09 VITALS — BP 140/82 | HR 64 | Ht 72.0 in | Wt 220.0 lb

## 2021-06-09 DIAGNOSIS — N433 Hydrocele, unspecified: Secondary | ICD-10-CM

## 2021-09-06 NOTE — Progress Notes (Incomplete)
? ?  09/06/21 ?7:59 AM  ? ?Jonathan Blackwell ?January 12, 1961 ?[id number] ? ?Referring provider:  ?McLean-Scocuzza, Pasty Spillers, MD ?[address] ?[place],  [place] [address] ?No chief complaint on file. ? ? ? ?HPI: ?Jonathan Blackwell is a 61 y.o.male with a personal history of hydrocele and right scrotal pain, who presents today for a 3 month follow-up for symptom recheck.  ? ?He is s/p right hydrocelectomy on 04/26/2021. Intraoperative findings showed 160 cc of clear straw fluid drained from right hydrocele sac. Uncomplicated procedure. ? ?Exam  on 06/09/2021 showed small enlarged softball size  right scrotum possibly consistent with hematoma of the cord versus a recurrent hydrocele. ? ? ? ? ? ?PMH: ?Past Medical History:  ?Diagnosis Date  ? Allergic rhinitis due to pollen   ? COVID-19   ? 01/06/20 +   ? Fatty liver   ? HTN (hypertension)   ? Hyperlipidemia   ? ITP (idiopathic thrombocytopenic purpura)   ? off prednisone since 02/2010, ?etiology per pt 2/2 antibiotics   ? ? ?Surgical History: ?Past Surgical History:  ?Procedure Laterality Date  ? HYDROCELE EXCISION Right 04/26/2021  ? Procedure: HYDROCELECTOMY ADULT;  Surgeon: Vanna Scotland, MD;  Location: [facility] ORS;  Service: Urology;  Laterality: Right;  ? NASAL SINUS SURGERY    ? ? ?Home Medications:  ?Allergies as of 09/07/2021   ? ?   Reactions  ? Hydrocodone-chlorpheniramine Itching  ? ?  ? ?  ?Medication List  ?  ? ?  ? Accurate as of September 06, 2021  7:59 AM. If you have any questions, ask your nurse or doctor.  ?  ?  ? ?  ? ?losartan-hydrochlorothiazide 100-25 MG tablet ?Commonly known as: HYZAAR ?TAKE 1 TABLET BY MOUTH DAILY IN THE MORNING ?  ?MULTIVITAMIN ADULT PO ?Take by mouth. ?  ? ?  ? ? ?Allergies:  ?Allergies  ?Allergen Reactions  ? Hydrocodone-Chlorpheniramine Itching  ? ? ?Family History: ?Family History  ?Problem Relation Age of Onset  ? Arthritis Other   ? Hypertension Father   ? ? ?Social History:  reports that he has never smoked. He has never used smokeless  tobacco. He reports current alcohol use. He reports that he does not use drugs. ? ? ?Physical Exam: ?There were no vitals taken for this visit.  ?Constitutional:  Alert and oriented, No acute distress. ?HEENT: [place] AT, moist mucus membranes.  Trachea midline, no masses. ?Cardiovascular: No clubbing, cyanosis, or edema. ?Respiratory: Normal respiratory effort, no increased work of breathing. ?Skin: No rashes, bruises or suspicious lesions. ?Neurologic: Grossly intact, no focal deficits, moving all 4 extremities. ?Psychiatric: Normal mood and affect. ? ?Laboratory Data: ?Lab Results  ?Component Value Date  ? CREATININE 1.11 04/23/2021  ? ?Lab Results  ?Component Value Date  ? PSA 0.71 04/13/2020  ? PSA 0.61 09/28/2017  ? PSA 0.51 04/01/2013  ? ?Lab Results  ?Component Value Date  ? HGBA1C 5.7 11/18/2020  ? ? ?Urinalysis ? ? ?Pertinent Imaging: ? ? ? ?Assessment & Plan:   ? ? ?No follow-ups on file. ? ?I,Kailey Littlejohn,acting as a scribe for Vanna Scotland, MD.,have documented all relevant documentation on the behalf of Vanna Scotland, MD,as directed by  Vanna Scotland, MD while in the presence of Vanna Scotland, MD. ? ? ?[facility] ?[address], [address] ?[place], [place] [address] ?[phone] ?

## 2021-09-07 ENCOUNTER — Encounter: Payer: Self-pay | Admitting: Urology

## 2021-09-07 ENCOUNTER — Ambulatory Visit: Payer: BC Managed Care – PPO | Admitting: Urology

## 2021-11-29 ENCOUNTER — Other Ambulatory Visit: Payer: Self-pay | Admitting: Internal Medicine

## 2021-11-29 DIAGNOSIS — I10 Essential (primary) hypertension: Secondary | ICD-10-CM

## 2021-11-30 NOTE — Telephone Encounter (Signed)
LMTCB needs follow up appointment

## 2021-12-24 ENCOUNTER — Other Ambulatory Visit: Payer: Self-pay | Admitting: Internal Medicine

## 2021-12-24 DIAGNOSIS — I10 Essential (primary) hypertension: Secondary | ICD-10-CM

## 2022-01-07 ENCOUNTER — Other Ambulatory Visit: Payer: Self-pay | Admitting: Internal Medicine

## 2022-01-07 DIAGNOSIS — I10 Essential (primary) hypertension: Secondary | ICD-10-CM

## 2022-02-02 ENCOUNTER — Other Ambulatory Visit: Payer: Self-pay

## 2022-02-02 ENCOUNTER — Telehealth: Payer: Self-pay | Admitting: Internal Medicine

## 2022-02-02 DIAGNOSIS — I10 Essential (primary) hypertension: Secondary | ICD-10-CM

## 2022-02-02 NOTE — Telephone Encounter (Signed)
Pt need refill on losartan-hydrochlorothiazide sent to [facility]

## 2022-02-03 ENCOUNTER — Other Ambulatory Visit: Payer: Self-pay | Admitting: Family

## 2022-02-03 DIAGNOSIS — I10 Essential (primary) hypertension: Secondary | ICD-10-CM

## 2022-02-03 MED ORDER — LOSARTAN POTASSIUM-HCTZ 100-25 MG PO TABS: ORAL_TABLET | ORAL | 1 refills | Status: DC

## 2022-02-11 ENCOUNTER — Ambulatory Visit (INDEPENDENT_AMBULATORY_CARE_PROVIDER_SITE_OTHER): Payer: BC Managed Care – PPO | Admitting: Internal Medicine

## 2022-02-11 ENCOUNTER — Encounter: Payer: Self-pay | Admitting: Internal Medicine

## 2022-02-11 VITALS — BP 126/82 | HR 52 | Temp 98.1°F | Ht 72.0 in | Wt 216.6 lb

## 2022-02-11 DIAGNOSIS — R21 Rash and other nonspecific skin eruption: Secondary | ICD-10-CM

## 2022-02-11 DIAGNOSIS — I10 Essential (primary) hypertension: Secondary | ICD-10-CM

## 2022-02-11 DIAGNOSIS — L304 Erythema intertrigo: Secondary | ICD-10-CM

## 2022-02-11 DIAGNOSIS — R7303 Prediabetes: Secondary | ICD-10-CM

## 2022-02-11 DIAGNOSIS — K760 Fatty (change of) liver, not elsewhere classified: Secondary | ICD-10-CM

## 2022-02-11 DIAGNOSIS — M778 Other enthesopathies, not elsewhere classified: Secondary | ICD-10-CM

## 2022-02-11 DIAGNOSIS — Z125 Encounter for screening for malignant neoplasm of prostate: Secondary | ICD-10-CM

## 2022-02-11 DIAGNOSIS — Z1329 Encounter for screening for other suspected endocrine disorder: Secondary | ICD-10-CM

## 2022-02-11 DIAGNOSIS — Z0000 Encounter for general adult medical examination without abnormal findings: Secondary | ICD-10-CM

## 2022-02-11 DIAGNOSIS — Z1389 Encounter for screening for other disorder: Secondary | ICD-10-CM

## 2022-02-11 DIAGNOSIS — Z1211 Encounter for screening for malignant neoplasm of colon: Secondary | ICD-10-CM

## 2022-02-11 MED ORDER — LOSARTAN POTASSIUM-HCTZ 100-25 MG PO TABS: ORAL_TABLET | ORAL | 3 refills | Status: DC

## 2022-02-11 MED ORDER — CLOTRIMAZOLE-BETAMETHASONE 1-0.05 % EX CREA: 1.0000 | TOPICAL_CREAM | Freq: Every day | CUTANEOUS | 2 refills | Status: DC

## 2022-02-11 MED ORDER — FLUCONAZOLE 150 MG PO TABS: 150.0000 mg | ORAL_TABLET | ORAL | 0 refills | Status: DC

## 2022-02-11 NOTE — Patient Instructions (Addendum)
Dr. Keane Scrape ortho  MD No Physician   Primary Contact Information  Phone Fax E-mail Address  [phone] Not available [address]   [place] [address]                      Counter force brace  Voltaren gel  Ice/head   Dr. Clent Ridges new PCP   [facility] for colonoscopy  Phone Fax E-mail Address  [phone] Not available [address]   [place] [address]     Specialties     Gastroenterology      [facility] Ask your health care provider which exercises are safe for you. Do exercises exactly as told by your health care provider and adjust them as directed. It is normal to feel mild stretching, pulling, tightness, or discomfort as you do these exercises. Stop right away if you feel sudden pain or your pain gets worse. Do not begin these exercises until told by your health care provider. Stretching and range-of-motion exercises These exercises warm up your muscles and joints and improve the movement and flexibility of your elbow. Wrist flexion, assisted  Straighten your left / right elbow in front of you with your palm facing down toward the floor. If told by your health care provider, bend your left / right elbow to a 90-degree angle (right angle) at your side instead of holding it straight. With your other hand, gently push over the back of your left / right hand so your fingers point toward the floor (flexion). Stop when you feel a gentle stretch on the back of your forearm. Hold this position for __________ seconds. Repeat __________ times. Complete this exercise __________ times a day. Wrist extension, assisted  Straighten your left / right elbow in front of you with your palm facing up toward the ceiling. If told by your health care provider, bend your left / right elbow to a 90-degree angle (right angle) at your side instead of holding it straight. With your other hand, gently pull your left / right hand and  fingers toward the floor (extension). Stop when you feel a gentle stretch on the palm side of your forearm. Hold this position for __________ seconds. Repeat __________ times. Complete this exercise __________ times a day. Assisted forearm rotation, supination Sit or stand with your elbows at your side. Bend your left / right elbow to a 90-degree angle (right angle). Using your uninjured hand, turn your left / right palm up toward the ceiling (supination) until you feel a gentle stretch along the inside of your forearm. Hold this position for __________ seconds. Repeat __________ times. Complete this exercise __________ times a day. Assisted forearm rotation, pronation Sit or stand with your elbows at your side. Bend your left / right elbow to a 90-degree angle (right angle). Using your uninjured hand, turn your left / right palm down toward the floor (pronation) until you feel a gentle stretch along the outside of your forearm. Hold this position for __________ seconds. Repeat __________ times. Complete this exercise __________ times a day. Strengthening exercises These exercises build strength and endurance in your forearm and elbow. Endurance is the ability to use your muscles for a long time, even after they get tired. Radial deviation  Stand with a __________ weight or a hammer in your left / right hand. Or, sit while holding a rubber exercise band or tubing, with your left / right forearm supported on a table  or countertop. Position your forearm so that the thumb is facing the ceiling, as if you are going to clap your hands. This is the neutral position. Raise your hand upward in front of you so your thumb moves toward the ceiling (radial deviation), or pull up on the rubber tubing. Keep your forearm and elbow still while you move your wrist only. Hold this position for __________ seconds. Slowly return to the starting position. Repeat __________ times. Complete this exercise __________  times a day. Wrist extension, eccentric Sit with your left / right forearm palm-down and supported on a table or other surface. Let your left / right wrist extend over the edge of the surface. Hold a __________ weight or a piece of exercise band or tubing in your left / right hand. If using a rubber exercise band or tubing, hold the other end of the tubing with your other hand. Use your uninjured hand to move your left / right hand up toward the ceiling. Take your uninjured hand away and slowly return to the starting position using only your left / right hand. Lowering your arm under tension is called eccentric extension. Repeat __________ times. Complete this exercise __________ times a day. Wrist extension Do not do this exercise if it causes pain at the outside of your elbow. Only do this exercise once instructed by your health care provider. Sit with your left / right forearm supported on a table or other surface and your palm turned down toward the floor. Let your left / right wrist extend over the edge of the surface. Hold a __________ weight or a piece of rubber exercise band or tubing. If you are using a rubber exercise band or tubing, hold the band or tubing in place with your other hand to provide resistance. Slowly bend your wrist so your hand moves up toward the ceiling (extension). Move only your wrist, keeping your forearm and elbow still. Hold this position for __________ seconds. Slowly return to the starting position. Repeat __________ times. Complete this exercise __________ times a day. Forearm rotation, supination To do this exercise, you will need a lightweight hammer or rubber mallet. Sit with your left / right forearm supported on a table or other surface. Bend your elbow to a 90-degree angle (right angle). Position your forearm so that your palm is facing down toward the floor, with your hand resting over the edge of the table. Hold a hammer in your left / right hand. To  make this exercise easier, hold the hammer near the head of the hammer. To make this exercise harder, hold the hammer near the end of the handle. Without moving your wrist or elbow, slowly rotate your forearm so your palm faces up toward the ceiling (supination). Hold this position for __________ seconds. Slowly return to the starting position. Repeat __________ times. Complete this exercise __________ times a day. Shoulder blade squeeze Sit in a stable chair or stand with good posture. If you are sitting down, do not let your back touch the back of the chair. Your arms should be at your sides with your elbows bent to a 90-degree angle (right angle). Position your forearms so that your thumbs are facing the ceiling (neutral position). Without lifting your shoulders up, squeeze your shoulder blades tightly together. Hold this position for __________ seconds. Slowly release and return to the starting position. Repeat __________ times. Complete this exercise __________ times a day. This information is not intended to replace advice given to you by  your health care provider. Make sure you discuss any questions you have with your health care provider. Document Revised: 08/28/2019 Document Reviewed: 08/28/2019 Elsevier Patient Education  2023 Elsevier Inc.  Cholesterol Content in Foods Cholesterol is a waxy, fat-like substance that helps to carry fat in the blood. The body needs cholesterol in small amounts, but too much cholesterol can cause damage to the arteries and heart. What foods have cholesterol?  Cholesterol is found in animal-based foods, such as meat, seafood, and dairy. Generally, low-fat dairy and lean meats have less cholesterol than full-fat dairy and fatty meats. The milligrams of cholesterol per serving (mg per serving) of common cholesterol-containing foods are listed below. Meats and other proteins Egg -- one large whole egg has 186 mg. Veal shank -- 4 oz (113 g) has 141  mg. Lean ground [place] (93% lean) -- 4 oz (113 g) has 118 mg. Fat-trimmed lamb loin -- 4 oz (113 g) has 106 mg. Lean ground beef (90% lean) -- 4 oz (113 g) has 100 mg. Lobster -- 3.5 oz (99 g) has 90 mg. Pork loin chops -- 4 oz (113 g) has 86 mg. Canned salmon -- 3.5 oz (99 g) has 83 mg. Fat-trimmed beef top loin -- 4 oz (113 g) has 78 mg. Frankfurter -- 1 frank (3.5 oz or 99 g) has 77 mg. Crab -- 3.5 oz (99 g) has 71 mg. Roasted chicken without skin, white meat -- 4 oz (113 g) has 66 mg. Light bologna -- 2 oz (57 g) has 45 mg. Deli-cut [place] -- 2 oz (57 g) has 31 mg. Canned tuna -- 3.5 oz (99 g) has 31 mg. Tomasa Blase -- 1 oz (28 g) has 29 mg. Oysters and mussels (raw) -- 3.5 oz (99 g) has 25 mg. Mackerel -- 1 oz (28 g) has 22 mg. Trout -- 1 oz (28 g) has 20 mg. Pork sausage -- 1 link (1 oz or 28 g) has 17 mg. Salmon -- 1 oz (28 g) has 16 mg. Tilapia -- 1 oz (28 g) has 14 mg. Dairy Soft-serve ice cream --  cup (4 oz or 86 g) has 103 mg. Whole-milk yogurt -- 1 cup (8 oz or 245 g) has 29 mg. Cheddar cheese -- 1 oz (28 g) has 28 mg. American cheese -- 1 oz (28 g) has 28 mg. Whole milk -- 1 cup (8 oz or 250 mL) has 23 mg. 2% milk -- 1 cup (8 oz or 250 mL) has 18 mg. Cream cheese -- 1 tablespoon (Tbsp) (14.5 g) has 15 mg. Cottage cheese --  cup (4 oz or 113 g) has 14 mg. Low-fat (1%) milk -- 1 cup (8 oz or 250 mL) has 10 mg. Sour cream -- 1 Tbsp (12 g) has 8.5 mg. Low-fat yogurt -- 1 cup (8 oz or 245 g) has 8 mg. Nonfat Greek yogurt -- 1 cup (8 oz or 228 g) has 7 mg. Half-and-half cream -- 1 Tbsp (15 mL) has 5 mg. Fats and oils Cod liver oil -- 1 tablespoon (Tbsp) (13.6 g) has 82 mg. Butter -- 1 Tbsp (14 g) has 15 mg. Lard -- 1 Tbsp (12.8 g) has 14 mg. Bacon grease -- 1 Tbsp (12.9 g) has 14 mg. Mayonnaise -- 1 Tbsp (13.8 g) has 5-10 mg. Margarine -- 1 Tbsp (14 g) has 3-10 mg. The items listed above may not be a complete list of foods with cholesterol. Exact amounts of cholesterol  in these foods may vary depending on  specific ingredients and brands. Contact a dietitian for more information. What foods do not have cholesterol? Most plant-based foods do not have cholesterol unless you combine them with a food that has cholesterol. Foods without cholesterol include: Grains and cereals. Vegetables. Fruits. Vegetable oils, such as olive, canola, and sunflower oil. Legumes, such as peas, beans, and lentils. Nuts and seeds. Egg whites. The items listed above may not be a complete list of foods that do not have cholesterol. Contact a dietitian for more information. Summary The body needs cholesterol in small amounts, but too much cholesterol can cause damage to the arteries and heart. Cholesterol is found in animal-based foods, such as meat, seafood, and dairy. Generally, low-fat dairy and lean meats have less cholesterol than full-fat dairy and fatty meats. This information is not intended to replace advice given to you by your health care provider. Make sure you discuss any questions you have with your health care provider. Document Revised: 10/16/2020 Document Reviewed: 10/16/2020 Elsevier Patient Education  2023 Elsevier Inc.   High Cholesterol  High cholesterol is a condition in which the blood has high levels of a white, waxy substance similar to fat (cholesterol). The liver makes all the cholesterol that the body needs. The human body needs small amounts of cholesterol to help build cells. A person gets extra or excess cholesterol from the food that he or she eats. The blood carries cholesterol from the liver to the rest of the body. If you have high cholesterol, deposits (plaques) may build up on the walls of your arteries. Arteries are the blood vessels that carry blood away from your heart. These plaques make the arteries narrow and stiff. Cholesterol plaques increase your risk for heart attack and stroke. Work with your health care provider to keep your cholesterol  levels in a healthy range. What increases the risk? The following factors may make you more likely to develop this condition: Eating foods that are high in animal fat (saturated fat) or cholesterol. Being overweight. Not getting enough exercise. A family history of high cholesterol (familial hypercholesterolemia). Use of tobacco products. Having diabetes. What are the signs or symptoms? In most cases, high cholesterol does not usually cause any symptoms. In severe cases, very high cholesterol levels can cause: Fatty bumps under the skin (xanthomas). A white or gray ring around the black center (pupil) of the eye. How is this diagnosed? This condition may be diagnosed based on the results of a blood test. If you are older than 60 years of age, your health care provider may check your cholesterol levels every 4-6 years. You may be checked more often if you have high cholesterol or other risk factors for heart disease. The blood test for cholesterol measures: "Bad" cholesterol, or LDL cholesterol. This is the main type of cholesterol that causes heart disease. The desired level is less than 100 mg/dL (1.612.59 mmol/L). "Good" cholesterol, or HDL cholesterol. HDL helps protect against heart disease by cleaning the arteries and carrying the LDL to the liver for processing. The desired level for HDL is 60 mg/dL (0.961.55 mmol/L) or higher. Triglycerides. These are fats that your body can store or burn for energy. The desired level is less than 150 mg/dL (0.451.69 mmol/L). Total cholesterol. This measures the total amount of cholesterol in your blood and includes LDL, HDL, and triglycerides. The desired level is less than 200 mg/dL (4.095.17 mmol/L). How is this treated? Treatment for high cholesterol starts with lifestyle changes, such as diet and exercise. Diet changes.  You may be asked to eat foods that have more fiber and less saturated fats or added sugar. Lifestyle changes. These may include regular  exercise, maintaining a healthy weight, and quitting use of tobacco products. Medicines. These are given when diet and lifestyle changes have not worked. You may be prescribed a statin medicine to help lower your cholesterol levels. Follow these instructions at home: Eating and drinking  Eat a healthy, balanced diet. This diet includes: Daily servings of a variety of fresh, frozen, or canned fruits and vegetables. Daily servings of whole grain foods that are rich in fiber. Foods that are low in saturated fats and trans fats. These include poultry and fish without skin, lean cuts of meat, and low-fat dairy products. A variety of fish, especially oily fish that contain omega-3 fatty acids. Aim to eat fish at least 2 times a week. Avoid foods and drinks that have added sugar. Use healthy cooking methods, such as roasting, grilling, broiling, baking, poaching, steaming, and stir-frying. Do not fry your food except for stir-frying. If you drink alcohol: Limit how much you have to: 0-1 drink a day for women who are not pregnant. 0-2 drinks a day for men. Know how much alcohol is in a drink. In the [place] one drink equals one 12 oz bottle of beer (355 mL), one 5 oz glass of wine (148 mL), or one 1 oz glass of hard liquor (44 mL). Lifestyle  Get regular exercise. Aim to exercise for a total of 150 minutes a week. Increase your activity level by doing activities such as gardening, walking, and taking the stairs. Do not use any products that contain nicotine or tobacco. These products include cigarettes, chewing tobacco, and vaping devices, such as e-cigarettes. If you need help quitting, ask your health care provider. General instructions Take over-the-counter and prescription medicines only as told by your health care provider. Keep all follow-up visits. This is important. Where to find more information American Heart Association: www.heart.org National Heart, Lung, and Blood Institute:  PopSteam.is Contact a health care provider if: You have trouble achieving or maintaining a healthy diet or weight. You are starting an exercise program. You are unable to stop smoking. Get help right away if: You have chest pain. You have trouble breathing. You have discomfort or pain in your jaw, neck, back, shoulder, or arm. You have any symptoms of a stroke. "BE FAST" is an easy way to remember the main warning signs of a stroke: B - Balance. Signs are dizziness, sudden trouble walking, or loss of balance. E - Eyes. Signs are trouble seeing or a sudden change in vision. F - Face. Signs are sudden weakness or numbness of the face, or the face or eyelid drooping on one side. A - Arms. Signs are weakness or numbness in an arm. This happens suddenly and usually on one side of the body. S - Speech. Signs are sudden trouble speaking, slurred speech, or trouble understanding what people say. T - Time. Time to call emergency services. Write down what time symptoms started. You have other signs of a stroke, such as: A sudden, severe headache with no known cause. Nausea or vomiting. Seizure. These symptoms may represent a serious problem that is an emergency. Do not wait to see if the symptoms will go away. Get medical help right away. Call your local emergency services (911 in the [place] Do not drive yourself to the hospital. Summary Cholesterol plaques increase your risk for heart attack and stroke. Work with  your health care provider to keep your cholesterol levels in a healthy range. Eat a healthy, balanced diet, get regular exercise, and maintain a healthy weight. Do not use any products that contain nicotine or tobacco. These products include cigarettes, chewing tobacco, and vaping devices, such as e-cigarettes. Get help right away if you have any symptoms of a stroke. This information is not intended to replace advice given to you by your health care provider. Make sure you discuss  any questions you have with your health care provider. Document Revised: 08/20/2020 Document Reviewed: 08/10/2020 Elsevier Patient Education  2023 Elsevier Inc.  Zoster Vaccine, Recombinant injection What is this medication? ZOSTER VACCINE (ZOS ter vak SEEN) is a vaccine used to reduce the risk of getting shingles. This vaccine is not used to treat shingles or nerve pain from shingles. This medicine may be used for other purposes; ask your health care provider or pharmacist if you have questions. COMMON BRAND NAME(S): [facility] What should I tell my care team before I take this medication? They need to know if you have any of these conditions: cancer immune system problems an unusual or allergic reaction to Zoster vaccine, other medications, foods, dyes, or preservatives pregnant or trying to get pregnant breast-feeding How should I use this medication? This vaccine is injected into a muscle. It is given by a health care provider. A copy of Vaccine Information Statements will be given before each vaccination. Be sure to read this information carefully each time. This sheet may change often. Talk to your health care provider about the use of this vaccine in children. This vaccine is not approved for use in children. Overdosage: If you think you have taken too much of this medicine contact a poison control center or emergency room at once. NOTE: This medicine is only for you. Do not share this medicine with others. What if I miss a dose? Keep appointments for follow-up (booster) doses. It is important not to miss your dose. Call your health care provider if you are unable to keep an appointment. What may interact with this medication? medicines that suppress your immune system medicines to treat cancer steroid medicines like prednisone or cortisone This list may not describe all possible interactions. Give your health care provider a list of all the medicines, herbs, non-prescription drugs,  or dietary supplements you use. Also tell them if you smoke, drink alcohol, or use illegal drugs. Some items may interact with your medicine. What should I watch for while using this medication? Visit your health care provider regularly. This vaccine, like all vaccines, may not fully protect everyone. What side effects may I notice from receiving this medication? Side effects that you should report to your doctor or health care professional as soon as possible: allergic reactions (skin rash, itching or hives; swelling of the face, lips, or tongue) trouble breathing Side effects that usually do not require medical attention (report these to your doctor or health care professional if they continue or are bothersome): chills headache fever nausea pain, redness, or irritation at site where injected tiredness vomiting This list may not describe all possible side effects. Call your doctor for medical advice about side effects. You may report side effects to FDA at 1-800-FDA-1088. Where should I keep my medication? This vaccine is only given by a health care provider. It will not be stored at home. NOTE: This sheet is a summary. It may not cover all possible information. If you have questions about this medicine, talk to your  doctor, pharmacist, or health care provider.  2023 Elsevier/Gold Standard (2021-05-07 00:00:00)   Colonoscopy, Adult A colonoscopy is a procedure to look at the entire large intestine. This procedure is done using a long, thin, flexible tube that has a camera on the end. You may have a colonoscopy: As a part of normal colorectal screening. If you have certain symptoms, such as: A low number of red blood cells in your blood (anemia). Diarrhea that does not go away. Pain in your abdomen. Blood in your stool. A colonoscopy can help screen for and diagnose medical problems, including: An abnormal growth of cells or tissue (tumor). Abnormal growths within the lining of  your intestine (polyps). Inflammation. Areas of bleeding. Tell your health care provider about: Any allergies you have. All medicines you are taking, including vitamins, herbs, eye drops, creams, and over-the-counter medicines. Any problems you or family members have had with anesthetic medicines. Any bleeding problems you have. Any surgeries you have had. Any medical conditions you have. Any problems you have had with having bowel movements. Whether you are pregnant or may be pregnant. What are the risks? Generally, this is a safe procedure. However, problems may occur, including: Bleeding. Damage to your intestine. Allergic reactions to medicines given during the procedure. Infection. This is rare. What happens before the procedure? Eating and drinking restrictions Follow instructions from your health care provider about eating or drinking restrictions, which may include: A few days before the procedure: Follow a low-fiber diet. Avoid nuts, seeds, dried fruit, raw fruits, and vegetables. 1-3 days before the procedure: Eat only gelatin dessert or ice pops. Drink only clear liquids, such as water, clear juice, clear broth or bouillon, black coffee or tea, or clear soft drinks or sports drinks. Avoid liquids that contain red or purple dye. The day of the procedure: Do not eat solid foods. You may continue to drink clear liquids until up to 2 hours before the procedure. Do not eat or drink anything starting 2 hours before the procedure, or within the time period that your health care provider recommends. Bowel prep If you were prescribed a bowel prep to take by mouth (orally) to clean out your colon: Take it as told by your health care provider. Starting the day before your procedure, you will need to drink a large amount of liquid medicine. The liquid will cause you to have many bowel movements of loose stool until your stool becomes almost clear or light green. If your skin or the  opening between the buttocks (anus) gets irritated from diarrhea, you may relieve the irritation using: Wipes with medicine in them, such as adult wet wipes with aloe and vitamin E. A product to soothe skin, such as petroleum jelly. If you vomit while drinking the bowel prep: Take a break for up to 60 minutes. Begin the bowel prep again. Call your health care provider if you keep vomiting or you cannot take the bowel prep without vomiting. To clean out your colon, you may also be given: Laxative medicines. These help you have a bowel movement. Instructions for enema use. An enema is liquid medicine injected into your rectum. Medicines Ask your health care provider about: Changing or stopping your regular medicines or supplements. This is especially important if you are taking iron supplements, diabetes medicines, or blood thinners. Taking medicines such as aspirin and ibuprofen. These medicines can thin your blood. Do not take these medicines unless your health care provider tells you to take them. Taking over-the-counter medicines,  vitamins, herbs, and supplements. General instructions Ask your health care provider what steps will be taken to help prevent infection. These may include washing skin with a germ-killing soap. If you will be going home right after the procedure, plan to have a responsible adult: Take you home from the hospital or clinic. You will not be allowed to drive. Care for you for the time you are told. What happens during the procedure?  An IV will be inserted into one of your veins. You will be given a medicine to make you fall asleep (general anesthetic). You will lie on your side with your knees bent. A lubricant will be put on the tube. Then the tube will be: Inserted into your anus. Gently eased through all parts of your large intestine. Air will be sent into your colon to keep it open. This may cause some pressure or cramping. Images will be taken with the  camera and will appear on a screen. A small tissue sample may be removed to be looked at under a microscope (biopsy). The tissue may be sent to a lab for testing if any signs of problems are found. If small polyps are found, they may be removed and checked for cancer cells. When the procedure is finished, the tube will be removed. The procedure may vary among health care providers and hospitals. What happens after the procedure? Your blood pressure, heart rate, breathing rate, and blood oxygen level will be monitored until you leave the hospital or clinic. You may have a small amount of blood in your stool. You may pass gas and have mild cramping or bloating in your abdomen. This is caused by the air that was used to open your colon during the exam. If you were given a sedative during the procedure, it can affect you for several hours. Do not drive or operate machinery until your health care provider says that it is safe. It is up to you to get the results of your procedure. Ask your health care provider, or the department that is doing the procedure, when your results will be ready. Summary A colonoscopy is a procedure to look at the entire large intestine. Follow instructions from your health care provider about eating and drinking before the procedure. If you were prescribed an oral bowel prep to clean out your colon, take it as told by your health care provider. During the colonoscopy, a flexible tube with a camera on its end is inserted into the anus and then passed into all parts of the large intestine. This information is not intended to replace advice given to you by your health care provider. Make sure you discuss any questions you have with your health care provider. Document Revised: 05/31/2021 Document Reviewed: 01/27/2021 Elsevier Patient Education  2023 [employer].

## 2022-02-11 NOTE — Progress Notes (Signed)
Chief Complaint  Patient presents with   Rash    Rash on right inner thigh he has been dealing with for a month   Annual Exam   Annual  1. Htn controlled hyzaar 100-25 qd took this 1 hr ago  2. Left lateral elbow pain with heavy lifting nothing tried will refer to ortho with heavy lifting worse  3. Red rash w/o blisters base  of penis triec hc 2.5 % af creams and does not help     Review of Systems  Constitutional:  Negative for weight loss.  HENT:  Negative for hearing loss.   Eyes:  Negative for blurred vision.  Respiratory:  Negative for shortness of breath.   Cardiovascular:  Negative for chest pain.  Gastrointestinal:  Negative for abdominal pain and blood in stool.  Genitourinary:  Negative for dysuria.  Musculoskeletal:  Negative for back pain, falls and joint pain.  Skin:  Positive for rash.  Neurological:  Negative for headaches.  Psychiatric/Behavioral:  Negative for depression.    Past Medical History:  Diagnosis Date   Allergic rhinitis due to pollen    COVID-19    01/06/20 + , x 2 total had covid   Fatty liver    HTN (hypertension)    Hyperlipidemia    ITP (idiopathic thrombocytopenic purpura)    off prednisone since 02/2010, ?etiology per pt 2/2 antibiotics    Past Surgical History:  Procedure Laterality Date   HYDROCELE EXCISION Right 04/26/2021   Procedure: HYDROCELECTOMY ADULT;  Surgeon: Vanna Scotland, MD;  Location: [facility] ORS;  Service: Urology;  Laterality: Right;   NASAL SINUS SURGERY     Family History  Problem Relation Age of Onset   Arthritis Other    Hypertension Father    Social History   Socioeconomic History   Marital status: Divorced    Spouse name: Not on file   Number of children: 3   Years of education: Not on file   Highest education level: Not on file  Occupational History   Occupation: Transport planner, retired as of 2019    Employer: [employer]  Tobacco Use   Smoking status: Never   Smokeless tobacco: Never  Vaping  Use   Vaping Use: Never used  Substance and Sexual Activity   Alcohol use: Yes    Comment: occ wine   Drug use: No   Sexual activity: Not on file  Other Topics Concern   Not on file  Social History Narrative   Married with kids divorced as of 03/2020, 30+ years marriage    Retired Chartered loss adjuster works [employer]   3 kids as of 03/2020 2 girls and 1 boy ages 39, 53, 43    Dad lives with him    Social Determinants of Corporate investment banker Strain: Not on file  Food Insecurity: Not on file  Transportation Needs: Not on file  Physical Activity: Not on file  Stress: Not on file  Social Connections: Not on file  Intimate Partner Violence: Not on file   Current Meds  Medication Sig   clotrimazole-betamethasone (LOTRISONE) cream Apply 1 Application topically daily.   fluconazole (DIFLUCAN) 150 MG tablet Take 1 tablet (150 mg total) by mouth once a week. X2-4 weeks   Multiple Vitamin (MULTIVITAMIN ADULT PO) Take by mouth.   [DISCONTINUED] losartan-hydrochlorothiazide (HYZAAR) 100-25 MG tablet TAKE 1 TABLET BY MOUTH EVERY DAY IN THE MORNING   Allergies  Allergen Reactions   Hydrocodone-Chlorpheniramine Itching   No results found for  this or any previous visit (from the past 2160 hour(s)). Objective  Body mass index is 29.38 kg/m. Wt Readings from Last 3 Encounters:  02/11/22 216 lb 9.6 oz (98.2 kg)  06/09/21 220 lb (99.8 kg)  06/03/21 220 lb (99.8 kg)   Temp Readings from Last 3 Encounters:  02/11/22 98.1 F (36.7 C) (Oral)  06/03/21 97.9 F (36.6 C)  04/26/21 97.8 F (36.6 C) (Temporal)   BP Readings from Last 3 Encounters:  02/11/22 126/82  06/09/21 140/82  05/21/21 (!) 162/80   Pulse Readings from Last 3 Encounters:  02/11/22 (!) 52  06/09/21 64  05/21/21 65    Physical Exam Vitals and nursing note reviewed.  Constitutional:      Appearance: Normal appearance. He is well-developed and well-groomed.  HENT:     Head: Normocephalic and atraumatic.  Eyes:      Conjunctiva/sclera: Conjunctivae normal.     Pupils: Pupils are equal, round, and reactive to light.  Cardiovascular:     Rate and Rhythm: Normal rate and regular rhythm.     Heart sounds: Normal heart sounds.  Pulmonary:     Effort: Pulmonary effort is normal. No respiratory distress.     Breath sounds: Normal breath sounds.  Abdominal:     Tenderness: There is no abdominal tenderness.  Skin:    General: Skin is warm and moist.  Neurological:     General: No focal deficit present.     Mental Status: He is alert and oriented to person, place, and time. Mental status is at baseline.     Sensory: Sensation is intact.     Motor: Motor function is intact.     Coordination: Coordination is intact.     Gait: Gait is intact. Gait normal.  Psychiatric:        Attention and Perception: Attention and perception normal.        Mood and Affect: Mood and affect normal.        Speech: Speech normal.        Behavior: Behavior normal. Behavior is cooperative.        Thought Content: Thought content normal.        Cognition and Memory: Cognition and memory normal.        Judgment: Judgment normal.     Assessment  Plan  Annual physical exam See below   Hypertension, controlled- Plan: Comprehensive metabolic panel, Lipid panel, CBC with Differential/Platelet Hyzaar hct 100-25 mg qd   Fatty liver - Plan: Ambulatory referral to Gastroenterology Encounter for screening colonoscopy - Plan: Ambulatory referral to Gastroenterology   Prediabetes - Plan: Hemoglobin A1c  Elbow tendonitis - Plan: Ambulatory referral to Orthopedic Surgery Counter force brace  Voltaren gel  Ice/head   Penile rash - Plan: fluconazole (DIFLUCAN) 150 MG tablet, clotrimazole-betamethasone (LOTRISONE) cream Tried hc 2.5 and AF and did not help   Intertrigo - Plan: fluconazole (DIFLUCAN) 150 MG tablet, clotrimazole-betamethasone (LOTRISONE) cream   HM Declines flu shot  UTD Tdap  Pfizer 2/2 had covid  7/19/21considering booster disc'ed today declines  rec hep A/B vaccine prev h/o fatty liver Hep C negative  psa 0.71 normal 04/13/20 colonoscopy referred [facility] exam appt 02/14/22 Referred [place] [facility] f/u yearly    Provider: Dr. French Ana McLean-Scocuzza-Internal Medicine

## 2022-02-17 ENCOUNTER — Other Ambulatory Visit: Payer: BC Managed Care – PPO

## 2022-02-17 ENCOUNTER — Encounter: Payer: Self-pay | Admitting: Internal Medicine

## 2022-03-28 ENCOUNTER — Telehealth (INDEPENDENT_AMBULATORY_CARE_PROVIDER_SITE_OTHER): Payer: BC Managed Care – PPO | Admitting: Internal Medicine

## 2022-03-28 ENCOUNTER — Encounter: Payer: Self-pay | Admitting: Internal Medicine

## 2022-03-28 DIAGNOSIS — L03114 Cellulitis of left upper limb: Secondary | ICD-10-CM

## 2022-03-28 DIAGNOSIS — Z862 Personal history of diseases of the blood and blood-forming organs and certain disorders involving the immune mechanism: Secondary | ICD-10-CM | POA: Diagnosis not present

## 2022-03-28 HISTORY — DX: Cellulitis of left upper limb: L03.114

## 2022-03-28 MED ORDER — SULFAMETHOXAZOLE-TRIMETHOPRIM 800-160 MG PO TABS: 1.0000 | ORAL_TABLET | Freq: Two times a day (BID) | ORAL | 0 refills | Status: DC

## 2022-03-28 NOTE — Progress Notes (Signed)
Virtual Visit via [place]   Note    This format is felt to be most appropriate for this patient at this time.  All issues noted in this document were discussed and addressed.  No physical exam was performed (except for noted visual exam findings with Video Visits).   I connected  Jonathan Blackwell on  03/28/22 at  4:45 PM EDT by a video enabled telemedicine application and verified that I am speaking with the correct person using two identifiers. Location patient: home Location provider: work or home office Persons participating in the virtual visit: patient, provider  I discussed the limitations, risks, security and privacy concerns of performing an evaluation and management service by telephone and the availability of in person appointments. I also discussed with the patient that there may be a patient responsible charge related to this service. The patient expressed understanding and agreed to proceed.  Reason for visit: insect bite on left lateral wrist   HPI:  61 yr old male with remote history of ITP following prolonged antibiotic use post operatively from sinus surgery presents with 3 days history of red pustular rash tht he believes was the result of a spider or ant bite. No fevers,  no pain.  Just pruritic. Has mlutple ant bites on both hands,  using benadryl cream.    Does not recall the antibiotics he took during remote episode of ITP in 2011.  He has no allergies to abx listed.   ROS: See pertinent positives and negatives per HPI.  Past Medical History:  Diagnosis Date   Allergic rhinitis due to pollen    COVID-19    01/06/20 + , x 2 total had covid   Fatty liver    HTN (hypertension)    Hyperlipidemia    ITP (idiopathic thrombocytopenic purpura)    off prednisone since 02/2010, ?etiology per pt 2/2 antibiotics     Past Surgical History:  Procedure Laterality Date   HYDROCELE EXCISION Right 04/26/2021   Procedure: HYDROCELECTOMY ADULT;  Surgeon: Hollice Espy, MD;   Location: [facility] ORS;  Service: Urology;  Laterality: Right;   NASAL SINUS SURGERY      Family History  Problem Relation Age of Onset   Arthritis Other    Hypertension Father     SOCIAL HX:  reports that he has never smoked. He has never used smokeless tobacco. He reports current alcohol use. He reports that he does not use drugs.    Current Outpatient Medications:    clotrimazole-betamethasone (LOTRISONE) cream, Apply 1 Application topically daily., Disp: 30 g, Rfl: 2   fluconazole (DIFLUCAN) 150 MG tablet, Take 1 tablet (150 mg total) by mouth once a week. X2-4 weeks, Disp: 4 tablet, Rfl: 0   losartan-hydrochlorothiazide (HYZAAR) 100-25 MG tablet, TAKE 1 TABLET BY MOUTH EVERY DAY IN THE MORNING, Disp: 90 tablet, Rfl: 3   Multiple Vitamin (MULTIVITAMIN ADULT PO), Take by mouth., Disp: , Rfl:    sulfamethoxazole-trimethoprim (BACTRIM DS) 800-160 MG tablet, Take 1 tablet by mouth 2 (two) times daily., Disp: 14 tablet, Rfl: 0  EXAM:  VITALS per patient if applicable:  GENERAL: alert, oriented, appears well and in no acute distress  HEENT: atraumatic, conjunttiva clear, no obvious abnormalities on inspection of external nose and ears  NECK: normal movements of the head and neck  LUNGS: on inspection no signs of respiratory distress, breathing rate appears normal, no obvious gross SOB, gasping or wheezing  CV: no obvious cyanosis  MS: moves all visible extremities without noticeable abnormality  Skin: left wrist with rectangular shaped erythematous patch with 2 white papules   PSYCH/NEURO: pleasant and cooperative, no obvious depression or anxiety, speech and thought processing grossly intact  ASSESSMENT AND PLAN:  Discussed the following assessment and plan:  Cellulitis of left wrist  History of ITP  Cellulitis of left wrist Secondary to insect (spider) bite.  empiric  Septra DS prescrbied.  History of ITP Inciting event reportedly antibiotics post operatively from  sinus surgery remotely.     I discussed the assessment and treatment plan with the patient. The patient was provided an opportunity to ask questions and all were answered. The patient agreed with the plan and demonstrated an understanding of the instructions.   The patient was advised to call back or seek an in-person evaluation if the symptoms worsen or if the condition fails to improve as anticipated.   I spent 20 minutes dedicated to the care of this patient on the date of this encounter to include pre-visit review of his medical history,  Face-to-face time with the patient , and post visit ordering of testing and therapeutics.    Sherlene Shams, MD

## 2022-03-28 NOTE — Assessment & Plan Note (Signed)
Inciting event reportedly antibiotics post operatively from sinus surgery remotely.

## 2022-03-28 NOTE — Assessment & Plan Note (Signed)
Secondary to insect (spider) bite.  empiric  Septra DS prescrbied.

## 2022-03-30 ENCOUNTER — Other Ambulatory Visit (INDEPENDENT_AMBULATORY_CARE_PROVIDER_SITE_OTHER): Payer: BC Managed Care – PPO

## 2022-03-30 DIAGNOSIS — Z125 Encounter for screening for malignant neoplasm of prostate: Secondary | ICD-10-CM | POA: Diagnosis not present

## 2022-03-30 DIAGNOSIS — I10 Essential (primary) hypertension: Secondary | ICD-10-CM

## 2022-03-30 DIAGNOSIS — R7303 Prediabetes: Secondary | ICD-10-CM | POA: Diagnosis not present

## 2022-03-30 DIAGNOSIS — Z1389 Encounter for screening for other disorder: Secondary | ICD-10-CM | POA: Diagnosis not present

## 2022-03-30 DIAGNOSIS — Z1329 Encounter for screening for other suspected endocrine disorder: Secondary | ICD-10-CM | POA: Diagnosis not present

## 2022-03-30 LAB — CBC WITH DIFFERENTIAL/PLATELET
Basophils Absolute: 0 10*3/uL (ref 0.0–0.1)
Basophils Relative: 0.6 % (ref 0.0–3.0)
Eosinophils Absolute: 0.2 10*3/uL (ref 0.0–0.7)
Eosinophils Relative: 3.7 % (ref 0.0–5.0)
HCT: 45.3 % (ref 39.0–52.0)
Hemoglobin: 15.2 g/dL (ref 13.0–17.0)
Lymphocytes Relative: 31.2 % (ref 12.0–46.0)
Lymphs Abs: 1.9 10*3/uL (ref 0.7–4.0)
MCHC: 33.7 g/dL (ref 30.0–36.0)
MCV: 92.4 fl (ref 78.0–100.0)
Monocytes Absolute: 0.5 10*3/uL (ref 0.1–1.0)
Monocytes Relative: 9.1 % (ref 3.0–12.0)
Neutro Abs: 3.3 10*3/uL (ref 1.4–7.7)
Neutrophils Relative %: 55.4 % (ref 43.0–77.0)
Platelets: 195 10*3/uL (ref 150.0–400.0)
RBC: 4.9 Mil/uL (ref 4.22–5.81)
RDW: 13.6 % (ref 11.5–15.5)
WBC: 6 10*3/uL (ref 4.0–10.5)

## 2022-03-30 LAB — TSH: TSH: 2.08 u[IU]/mL (ref 0.35–5.50)

## 2022-03-30 LAB — COMPREHENSIVE METABOLIC PANEL
ALT: 32 U/L (ref 0–53)
AST: 21 U/L (ref 0–37)
Albumin: 4.5 g/dL (ref 3.5–5.2)
Alkaline Phosphatase: 54 U/L (ref 39–117)
BUN: 16 mg/dL (ref 6–23)
CO2: 30 mEq/L (ref 19–32)
Calcium: 9.8 mg/dL (ref 8.4–10.5)
Chloride: 100 mEq/L (ref 96–112)
Creatinine, Ser: 1.22 mg/dL (ref 0.40–1.50)
GFR: 64.65 mL/min (ref >60.00)
Glucose, Bld: 88 mg/dL (ref 70–99)
Potassium: 4.2 mEq/L (ref 3.5–5.1)
Sodium: 138 mEq/L (ref 135–145)
Total Bilirubin: 1.1 mg/dL (ref 0.2–1.2)
Total Protein: 6.8 g/dL (ref 6.0–8.3)

## 2022-03-30 LAB — LIPID PANEL
Cholesterol: 197 mg/dL (ref 0–200)
HDL: 52.2 mg/dL (ref >39.00)
LDL Cholesterol: 123 mg/dL — ABNORMAL HIGH (ref 0–99)
NonHDL: 144.76
Total CHOL/HDL Ratio: 4
Triglycerides: 107 mg/dL (ref 0.0–149.0)
VLDL: 21.4 mg/dL (ref 0.0–40.0)

## 2022-03-30 LAB — PSA: PSA: 0.34 ng/mL (ref 0.10–4.00)

## 2022-03-30 LAB — HEMOGLOBIN A1C: Hgb A1c MFr Bld: 5.7 % (ref 4.6–6.5)

## 2022-03-31 LAB — URINALYSIS, ROUTINE W REFLEX MICROSCOPIC
Bilirubin Urine: NEGATIVE
Glucose, UA: NEGATIVE
Hgb urine dipstick: NEGATIVE
Ketones, ur: NEGATIVE
Leukocytes,Ua: NEGATIVE
Nitrite: NEGATIVE
Protein, ur: NEGATIVE
Specific Gravity, Urine: 1.015 (ref 1.001–1.035)
pH: 6 (ref 5.0–8.0)

## 2022-04-11 ENCOUNTER — Telehealth: Payer: Self-pay

## 2022-04-11 NOTE — Telephone Encounter (Signed)
Pain in bottom of right foot (pad of his foot) - patient can't put weight on this area and it is swollen.  Patient states this started on 04/08/2022.  I called Access Nurse and gave them the information, they will have the next available nurse call patient.  I relayed message to patient.

## 2022-04-11 NOTE — Telephone Encounter (Signed)
Access Nurse called back with patient and has recommended that patient go to Urgent Care today.  Patient states he would like to see if anything has opened up with [facility] in [place] or [facility] and it has not.  Access Nurse urged patient to go to Urgent Care.

## 2022-04-12 ENCOUNTER — Ambulatory Visit
Admission: RE | Admit: 2022-04-12 | Discharge: 2022-04-12 | Disposition: A | Discharge status: Home / Self Care | Payer: BC Managed Care – PPO | Source: Ambulatory Visit | Attending: Urgent Care | Admitting: Urgent Care

## 2022-04-12 VITALS — BP 141/87 | HR 60 | Temp 97.9°F | Resp 17

## 2022-04-12 DIAGNOSIS — M7989 Other specified soft tissue disorders: Secondary | ICD-10-CM

## 2022-04-12 DIAGNOSIS — M79674 Pain in right toe(s): Secondary | ICD-10-CM

## 2022-04-12 MED ORDER — PREDNISONE 10 MG (21) PO TBPK: ORAL_TABLET | Freq: Every day | ORAL | 0 refills | Status: DC

## 2022-04-12 NOTE — ED Provider Notes (Signed)
[facility]    CSN: [id number] Arrival date & time: 04/12/22  1120      History   Chief Complaint Chief Complaint  Patient presents with   Foot Pain    Pain in right foot, pad. Swelling, no known injury... - Entered by patient    HPI Jonathan Blackwell is a 61 y.o. male.    Foot Pain    Presents to UC with complaint of right foot pain starting Friday.  He endorses severe pain getting out of bed.  Unable to bear weight.  Swelling on the dorsal side of the foot near his toes with pain on the plantar side of his foot across all 5 metatarsals.  He has been treating his foot with ice, NSAIDs, Tylenol and reports swelling redness and pain is reduced.  Denies history of gout.  Denies known injury or precipitating event.  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  Problem list shows history of prediabetes with most recent A1c equals 5.7.  Past Medical History:  Diagnosis Date   Allergic rhinitis due to pollen    COVID-19    01/06/20 + , x 2 total had covid   Fatty liver    HTN (hypertension)    Hyperlipidemia    ITP (idiopathic thrombocytopenic purpura)    off prednisone since 02/2010, ?etiology per pt 2/2 antibiotics     Patient Active Problem List   Diagnosis Date Noted   Cellulitis of left wrist 03/28/2022   COVID-19    Vitamin D deficiency 04/13/2020   Prediabetes 04/13/2020   History of ITP 04/13/2020   Acute right-sided low back pain without sciatica 04/10/2020   Mid back pain 04/10/2020   Oral herpes 04/10/2020   Obesity (BMI 30-39.9) 10/11/2017   Abnormal liver function test 10/11/2017   Finger pain, right 10/11/2017   Tinea corporis 10/11/2017   Annual physical exam 10/11/2017   Fatty liver 09/28/2017   HLD (hyperlipidemia) 04/05/2013   Essential hypertension 02/05/2009    Past Surgical History:  Procedure Laterality Date   HYDROCELE EXCISION Right 04/26/2021   Procedure: HYDROCELECTOMY  ADULT;  Surgeon: Hollice Espy, MD;  Location: [facility] ORS;  Service: Urology;  Laterality: Right;   NASAL SINUS SURGERY         Home Medications    Prior to Admission medications   Medication Sig Start Date End Date Taking? Authorizing Provider  clotrimazole-betamethasone (LOTRISONE) cream Apply 1 Application topically daily. 02/11/22   McLean-Scocuzza, Nino Glow, MD  fluconazole (DIFLUCAN) 150 MG tablet Take 1 tablet (150 mg total) by mouth once a week. X2-4 weeks 02/11/22   McLean-Scocuzza, Nino Glow, MD  losartan-hydrochlorothiazide (HYZAAR) 100-25 MG tablet TAKE 1 TABLET BY MOUTH EVERY DAY IN THE MORNING 02/11/22   McLean-Scocuzza, Nino Glow, MD  Multiple Vitamin (MULTIVITAMIN ADULT PO) Take by mouth.    [provider]  sulfamethoxazole-trimethoprim (BACTRIM DS) 800-160 MG tablet Take 1 tablet by mouth 2 (two) times daily. 03/28/22   Crecencio Mc, MD    Family History Family History  Problem Relation Age of Onset   Arthritis Other    Hypertension Father     Social History Social History   Tobacco Use   Smoking status: Never   Smokeless tobacco: Never  Vaping Use   Vaping Use: Never used  Substance Use Topics   Alcohol use: Yes    Comment: occ wine   Drug use: No  Allergies   Hydrocodone-chlorpheniramine   Review of Systems Review of Systems   Physical Exam Triage Vital Signs ED Triage Vitals  Enc Vitals Group     BP 04/12/22 1136 (!) 141/87     Pulse Rate 04/12/22 1136 60     Resp 04/12/22 1136 17     Temp 04/12/22 1136 97.9 F (36.6 C)     Temp src --      SpO2 04/12/22 1136 97 %     Weight --      Height --      Head Circumference --      Peak Flow --      Pain Score 04/12/22 1130 6     Pain Loc --      Pain Edu? --      Excl. in [place]? --    No data found.  Updated Vital Signs BP (!) 141/87   Pulse 60   Temp 97.9 F (36.6 C)   Resp 17   SpO2 97%   Visual Acuity Right Eye Distance:   Left Eye Distance:   Bilateral Distance:     Right Eye Near:   Left Eye Near:    Bilateral Near:     Physical Exam Musculoskeletal:       Feet:      UC Treatments / Results  Labs (all labs ordered are listed, but only abnormal results are displayed) Labs Reviewed - No data to display  EKG   Radiology No results found.  Procedures Procedures (including critical care time)  Medications Ordered in UC Medications - No data to display  Initial Impression / Assessment and Plan / UC Course  I have reviewed the triage vital signs and the nursing notes.  Pertinent labs & imaging results that were available during my care of the patient were reviewed by me and considered in my medical decision making (see chart for details).   Unclear etiology for his symptoms.  Gout versus injury.  His symptoms seem to be improving and I will recommend conservative treatment using NSAIDs.  However if symptoms do not resolve or significantly improve in the next couple of days, will provide a steroid taper for him to use optionally.  He has prediabetes.   Final Clinical Impressions(s) / UC Diagnoses   Final diagnoses:  None   Discharge Instructions   None    ED Prescriptions   None    PDMP not reviewed this encounter.   Rose Phi, [place] 04/12/22 1212

## 2022-04-12 NOTE — Discharge Instructions (Addendum)
Recommend you continue with OTC NSAID medications such as ibuprofen or naproxen.  I have prescribed a course of steroids for you to take if your symptoms do not resolve with conservative treatment.  Follow up here or with your primary care provider if your symptoms are worsening or not improving with treatment.

## 2022-04-12 NOTE — Telephone Encounter (Signed)
Pt transferred to access nurse due to pt c/o pain on R Foot- bottom of foot/ pad of foot, started on Friday and he isn't able to put pressure on foot. Foot is swollen, seems to have gotten better- is a little better. Has placed ice last night and this AM, has taken ibuprofen last night, states that top of foot/ toes are swollen- Was a little red yesterdayfeels normal to tough, is not significantly red. No HX of injury. RN advised pt to head over to UC or ED for further eval & we have no available appts in ofc or at [facility]

## 2022-04-12 NOTE — ED Triage Notes (Signed)
Pt. Presents to UC w/ c/o right foot pain that started Friday with some swelling and redness.. Pt. Has been treating his foot wit ice and tylenol and the swelling and redness has decreased since.

## 2022-04-12 NOTE — Telephone Encounter (Signed)
Given pain, swelling and redness - needs evaluation today.

## 2022-08-16 ENCOUNTER — Ambulatory Visit: Payer: BC Managed Care – PPO | Admitting: Family Medicine

## 2023-01-25 ENCOUNTER — Other Ambulatory Visit: Payer: Self-pay | Admitting: Family Medicine

## 2023-01-25 ENCOUNTER — Encounter: Payer: Self-pay | Admitting: Family Medicine

## 2023-01-25 ENCOUNTER — Telehealth: Payer: Self-pay | Admitting: Family Medicine

## 2023-01-25 DIAGNOSIS — B002 Herpesviral gingivostomatitis and pharyngotonsillitis: Secondary | ICD-10-CM

## 2023-01-25 MED ORDER — ACYCLOVIR 5 % EX OINT: 1.0000 | TOPICAL_OINTMENT | Freq: Four times a day (QID) | CUTANEOUS | 11 refills | Status: DC | PRN

## 2023-01-25 NOTE — Telephone Encounter (Signed)
Prescription Request  01/25/2023  LOV: Visit date not found  What is the name of the medication or equipment? Zovirax 5%  Have you contacted your pharmacy to request a refill? Yes   Which pharmacy would you like this sent to?   [id number] [address] [place] [address] Phone: [phone] Fax: [phone]    Patient notified that their request is being sent to the clinical staff for review and that they should receive a response within 2 business days.   Please advise at Mobile [phone] (mobile)   Pt has a TOC/physical with Clent Ridges on 03/13/23.

## 2023-01-25 NOTE — Telephone Encounter (Signed)
Called pt and informed him that the medication has been sent into the pharmacy.

## 2023-01-26 NOTE — Telephone Encounter (Signed)
RX sent into pharmacy

## 2023-02-02 ENCOUNTER — Encounter (INDEPENDENT_AMBULATORY_CARE_PROVIDER_SITE_OTHER): Payer: Self-pay

## 2023-03-13 ENCOUNTER — Encounter: Payer: BC Managed Care – PPO | Admitting: Family Medicine

## 2023-04-04 ENCOUNTER — Encounter: Payer: Self-pay | Admitting: Family Medicine

## 2023-05-15 ENCOUNTER — Telehealth: Payer: Self-pay

## 2023-05-15 NOTE — Telephone Encounter (Signed)
Pt has an appointment with Worthy Rancher 05/16/23.

## 2023-05-15 NOTE — Telephone Encounter (Signed)
Patient states right arm when he does a bicep curl motion, with no weight, it's painful going up and down around the bend of the arm. Patient states this has been going on for the last 35-40 days and he thought it would work itself out, but it has not.   Patient has scheduled a transfer of care appointment with Dr. Dana Allan.  I transferred call to Access Nurse.

## 2023-05-16 ENCOUNTER — Encounter: Payer: Self-pay | Admitting: Family

## 2023-05-16 ENCOUNTER — Ambulatory Visit (INDEPENDENT_AMBULATORY_CARE_PROVIDER_SITE_OTHER): Payer: BC Managed Care – PPO | Admitting: Family

## 2023-05-16 VITALS — BP 146/82 | HR 64 | Temp 98.0°F | Resp 16 | Ht 72.0 in | Wt 234.1 lb

## 2023-05-16 DIAGNOSIS — I10 Essential (primary) hypertension: Secondary | ICD-10-CM | POA: Diagnosis not present

## 2023-05-16 DIAGNOSIS — M79601 Pain in right arm: Secondary | ICD-10-CM | POA: Diagnosis not present

## 2023-05-16 DIAGNOSIS — L308 Other specified dermatitis: Secondary | ICD-10-CM

## 2023-05-16 DIAGNOSIS — R2231 Localized swelling, mass and lump, right upper limb: Secondary | ICD-10-CM | POA: Diagnosis not present

## 2023-05-16 MED ORDER — PREDNISONE 20 MG PO TABS: 40.0000 mg | ORAL_TABLET | Freq: Every day | ORAL | 0 refills | Status: DC

## 2023-05-16 MED ORDER — TRIAMCINOLONE ACETONIDE 0.1 % EX CREA: 1.0000 | TOPICAL_CREAM | Freq: Two times a day (BID) | CUTANEOUS | 0 refills | Status: DC

## 2023-05-16 NOTE — Progress Notes (Signed)
Acute Office Visit  Subjective:     Patient ID: Jonathan Blackwell, male    DOB: 1960-11-16, 62 y.o.   MRN: [id number]  Chief Complaint  Patient presents with  . Arm Pain    Right arm X 3 months are so at the bend of the elbow on the inside    HPI Patient is in today with concerns of right arm pain in the antecubital area x 3 months without resolution.  Reports he has had right arm weakness as a result of the pain.  Describes it as a sharp pain particularly if he is talking on the phone.  Denies any burning or tingling.  Does work from home as well does a lot of typing and is right-hand dominant.  Has taken Tylenol but has not been helpful.  Is able to identify swelling to the area.  No known injury.  Patient requesting a refill of triamcinolone for eczema on his right lower leg.  Typically occurs in the winter.  Out of medication.  Describes it as dry and itchy.  Patient has concerns of his blood pressure being elevated over the past several months.  Takes his blood pressure at home and it typically runs in the 140s systolically.  He has gained about 20 pounds for which he plans to reduce his weight.  Currently taking losartan HCT 100/25 and tolerating it well.  Has been on this medication for about 10 years.  Review of Systems  Constitutional: Negative.   Respiratory: Negative.    Cardiovascular: Negative.        Elevated blood pressure  Gastrointestinal: Negative.   Musculoskeletal:  Negative for back pain and neck pain.       Right arm pain and weakness  Skin:  Positive for rash.       Eczema right lower leg  Neurological:  Positive for weakness. Negative for tingling.       Right arm weakness  Endo/Heme/Allergies: Negative.   Psychiatric/Behavioral: Negative.    All other systems reviewed and are negative.  Past Medical History:  Diagnosis Date  . Allergic rhinitis due to pollen   . COVID-19    01/06/20 + , x 2 total had covid  . Fatty liver   . HTN (hypertension)   .  Hyperlipidemia   . ITP (idiopathic thrombocytopenic purpura)    off prednisone since 02/2010, ?etiology per pt 2/2 antibiotics     Social History   Socioeconomic History  . Marital status: Divorced    Spouse name: Not on file  . Number of children: 3  . Years of education: Not on file  . Highest education level: Not on file  Occupational History  . Occupation: Transport planner, retired as of 2019    Employer: [employer]  Tobacco Use  . Smoking status: Never  . Smokeless tobacco: Never  Vaping Use  . Vaping status: Never Used  Substance and Sexual Activity  . Alcohol use: Yes    Comment: occ wine  . Drug use: No  . Sexual activity: Not on file  Other Topics Concern  . 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 HYDROCELE EXCISION Right 04/26/2021   Procedure: HYDROCELECTOMY ADULT;  Surgeon: Vanna Scotland, MD;  Location: [facility] ORS;  Service: Urology;  Laterality: Right;  . NASAL SINUS SURGERY      Family History  Problem Relation Age of Onset  . Arthritis Other   . Hypertension Father     Allergies  Allergen Reactions  . Hydrocodone-Chlorpheniramine Itching    Current Outpatient Medications on File Prior to Visit  Medication Sig Dispense Refill  . acyclovir ointment (ZOVIRAX) 5 % Apply 1 Application topically every 6 (six) hours as needed. 30 g 11  . fluconazole (DIFLUCAN) 150 MG tablet Take 1 tablet (150 mg total) by mouth once a week. X2-4 weeks 4 tablet 0  .  losartan-hydrochlorothiazide (HYZAAR) 100-25 MG tablet TAKE 1 TABLET BY MOUTH EVERY DAY IN THE MORNING 90 tablet 3  . Multiple Vitamin (MULTIVITAMIN ADULT PO) Take by mouth.     No current facility-administered medications on file prior to visit.    BP (!) 146/82   Pulse 64   Temp 98 F (36.7 C)   Resp 16   Ht 6' (1.829 m)   Wt 234 lb 2 oz (106.2 kg)   SpO2 99%   BMI 31.75 kg/m chart     Objective:    BP (!) 146/82   Pulse 64   Temp 98 F (36.7 C)   Resp 16   Ht 6' (1.829 m)   Wt 234 lb 2 oz (106.2 kg)   SpO2 99%   BMI 31.75 kg/m    Physical Exam Vitals reviewed.  Constitutional:      Appearance: Normal appearance.     Comments: Weight up 20 pounds  Cardiovascular:     Rate and Rhythm: Normal rate and regular rhythm.     Pulses: Normal pulses.     Heart sounds: Normal heart sounds.  Pulmonary:     Effort: Pulmonary effort is normal.     Breath sounds: Normal breath sounds.  Musculoskeletal:        General: Normal range of motion.       Arms:     Cervical back: Normal range of motion and neck supple.     Comments: Swelling noted to the right lower extremity slightly below the antecubital area.  No tenderness to palpation.  Pain elicited with forced extension.  Skin:    General: Skin is warm and dry.     Capillary Refill: Capillary refill takes less than 2 seconds.     Findings: No bruising.  Neurological:     General: No focal deficit present.     Mental Status: He is alert and oriented to person, place, and time. Mental status is at baseline.     Comments: Grip strength diminished in the right hand  Psychiatric:        Mood and Affect: Mood normal.        Behavior: Behavior normal.   No results found for any visits on 05/16/23.      Assessment & Plan:   Problem List Items Addressed This Visit   None Visit Diagnoses     Right arm pain    -  Primary   Relevant Orders   [place] RT UPPER EXTREM LTD SOFT  TISSUE NON VASCULAR   Mass of arm, right        Relevant Orders   [place] RT UPPER EXTREM LTD SOFT TISSUE NON VASCULAR   Other eczema          Hypertension: Continue Hyzaar 100/25.  Discussed possibly adding amlodipine to his current therapy.  Patient wishes to wait to see if he can reduce his weight before his transfer of care appointment.  He is open to a medication change. Meds ordered this encounter  Medications  . predniSONE (DELTASONE) 20 MG tablet    Sig: Take 2 tablets (40 mg total) by mouth daily with breakfast.    Dispense:  10 tablet    Refill:  0  . triamcinolone cream (KENALOG) 0.1 %    Sig: Apply 1 Application topically 2 (two) times daily.    Dispense:  30 g    Refill:  0   Likely bicep tendinitis.  Will treat with prednisone ice and rest.  Because he has swelling to the area have ordered an ultrasound to just be sure that he does not have a cyst in that area that may be complicating things.  If his symptoms resolve with the prednisone we can cancel the ultrasound.  Call the office if symptoms worsen or persist.  Recheck as scheduled and sooner as needed. No follow-ups on file.  Eulis Foster, FNP

## 2023-05-17 ENCOUNTER — Telehealth: Payer: Self-pay | Admitting: Family

## 2023-05-17 NOTE — Telephone Encounter (Signed)
Lft pt vm to call ofc to sch [place]. thanks ?

## 2023-05-24 DIAGNOSIS — H35033 Hypertensive retinopathy, bilateral: Secondary | ICD-10-CM | POA: Diagnosis not present

## 2023-05-24 NOTE — Telephone Encounter (Signed)
Patient seen by Worthy Rancher 05/16/23

## 2023-05-25 ENCOUNTER — Telehealth: Payer: BC Managed Care – PPO | Admitting: Nurse Practitioner

## 2023-05-25 ENCOUNTER — Encounter: Payer: Self-pay | Admitting: Nurse Practitioner

## 2023-05-25 VITALS — BP 144/80 | Temp 98.0°F | Ht 72.0 in | Wt 234.0 lb

## 2023-05-25 DIAGNOSIS — J0100 Acute maxillary sinusitis, unspecified: Secondary | ICD-10-CM | POA: Insufficient documentation

## 2023-05-25 HISTORY — DX: Acute maxillary sinusitis, unspecified: J01.00

## 2023-05-25 MED ORDER — AMOXICILLIN-POT CLAVULANATE 875-125 MG PO TABS: 1.0000 | ORAL_TABLET | Freq: Two times a day (BID) | ORAL | 0 refills | Status: DC

## 2023-05-25 NOTE — Progress Notes (Signed)
MyChart Video Visit    Virtual Visit via Video Note   This visit type was conducted because this format is felt to be most appropriate for this patient at this time. Physical exam was limited by quality of the video and audio technology used for the visit. CMA was able to get the patient set up on a video visit.  Patient location: Home. Patient and provider in visit Provider location: Office  I discussed the limitations of evaluation and management by telemedicine and the availability of in person appointments. The patient expressed understanding and agreed to proceed.  Visit Date: 05/25/2023  Today's healthcare provider: Bethanie Dicker, NP     Subjective:    Patient ID: Jonathan Blackwell, male    DOB: [birth year], 62 y.o.   MRN: [id number]  Chief Complaint  Patient presents with   Sinus Problem    Congestion: yes Cough: no  Ear congestion: yes Headaches: yes  SOB: no Started: 3-4 days ago     HPI  Discussed the use of AI scribe software for clinical note transcription with the patient, who gave verbal consent to proceed.  History of Present Illness   The patient, presents with symptoms consistent with a sinus infection. Over the past five days, they have experienced increasing congestion, headaches, and pressure behind the eyes and cheekbones. They also report stuffiness in the ears and post-nasal drip, particularly at night. The patient denies any cough, throat discomfort, shortness of breath, fever, or changes in taste or smell. They have not been exposed to any known infectious diseases recently.  The patient has been managing their symptoms with Tylenol, but reports it to be ineffective. They were recently prescribed prednisone for an unrelated arm issue. They have not started any new medications recently.  The patient's symptoms are primarily located in the head, from the nose to the top of the head, with significant pressure behind the eyes and in the cheekbones.  Despite the discomfort, the patient denies any significant fatigue, attributing any tiredness to age.      Past Medical History:  Diagnosis Date   Allergic rhinitis due to pollen    COVID-19    01/06/20 + , x 2 total had covid   Fatty liver    HTN (hypertension)    Hyperlipidemia    ITP (idiopathic thrombocytopenic purpura)    off prednisone since 02/2010, ?etiology per pt 2/2 antibiotics     Past Surgical History:  Procedure Laterality Date   HYDROCELE EXCISION Right 04/26/2021   Procedure: HYDROCELECTOMY ADULT;  Surgeon: Vanna Scotland, MD;  Location: [facility] ORS;  Service: Urology;  Laterality: Right;   NASAL SINUS SURGERY      Family History  Problem Relation Age of Onset   Arthritis Other    Hypertension Father     Social History   Socioeconomic History   Marital status: Divorced    Spouse name: Not on file   Number of children: 3   Years of education: Not on file   Highest education level: Not on file  Occupational History   Occupation: Transport planner, retired as of 2019    Employer: [employer]  Tobacco Use   Smoking status: Never   Smokeless tobacco: Never  Vaping Use   Vaping status: Never Used  Substance and Sexual Activity   Alcohol use: Yes    Comment: occ wine   Drug use: No   Sexual activity: Not on file  Other Topics Concern   Not on file  Social History Narrative   Married with kids divorced as of 03/2020, 30+ years marriage    Retired Chartered loss adjuster works [place]   3 kids as of 03/2020 2 girls and 1 boy ages 72, 20, 65    Dad lives with him    Social Determinants of Corporate investment banker Strain: Not on file  Food Insecurity: Not on file  Transportation Needs: Not on file  Physical Activity: Not on file  Stress: Not on file  Social Connections: Not on file  Intimate Partner Violence: Not on file    Outpatient Medications Prior to Visit  Medication Sig Dispense Refill   acyclovir ointment (ZOVIRAX) 5 % Apply 1 Application  topically every 6 (six) hours as needed. 30 g 11   fluconazole (DIFLUCAN) 150 MG tablet Take 1 tablet (150 mg total) by mouth once a week. X2-4 weeks 4 tablet 0   losartan-hydrochlorothiazide (HYZAAR) 100-25 MG tablet TAKE 1 TABLET BY MOUTH EVERY DAY IN THE MORNING 90 tablet 3   Multiple Vitamin (MULTIVITAMIN ADULT PO) Take by mouth.     predniSONE (DELTASONE) 20 MG tablet Take 2 tablets (40 mg total) by mouth daily with breakfast. 10 tablet 0   triamcinolone cream (KENALOG) 0.1 % Apply 1 Application topically 2 (two) times daily. 30 g 0   No facility-administered medications prior to visit.    Allergies  Allergen Reactions   Hydrocodone-Chlorpheniramine Itching    ROS See HPI    Objective:    Physical Exam  BP (!) 144/80 Comment: Pt reported  Temp 98 F (36.7 C) Comment: pt reported  Ht 6' (1.829 m)   Wt 234 lb (106.1 kg)   BMI 31.74 kg/m  Wt Readings from Last 3 Encounters:  05/25/23 234 lb (106.1 kg)  05/16/23 234 lb 2 oz (106.2 kg)  03/28/22 211 lb (95.7 kg)   GENERAL: alert, oriented, appears well and in no acute distress   HEENT: atraumatic, conjunttiva clear, no obvious abnormalities on inspection of external nose and ears   NECK: normal movements of the head and neck   LUNGS: on inspection no signs of respiratory distress, breathing rate appears normal, no obvious gross SOB, gasping or wheezing   CV: no obvious cyanosis   MS: moves all visible extremities without noticeable abnormality   PSYCH/NEURO: pleasant and cooperative, no obvious depression or anxiety, speech and thought processing grossly intact    Assessment & Plan:   Problem List Items Addressed This Visit       Respiratory   Acute non-recurrent maxillary sinusitis - Primary    They present with a 5-day history of sinus congestion, headaches, and pressure behind the eyes and cheekbones, without fever, cough, or shortness of breath, and no exposure to COVID or flu. We will prescribe Augmentin  875 BID x 10 days and advise continuing Tylenol as needed for pain. Maintaining adequate hydration and taking a probiotic or eating yogurt to counter potential antibiotic side effects are recommended. Return precautions given to patient.       Relevant Medications   amoxicillin-clavulanate (AUGMENTIN) 875-125 MG tablet    I am having Jedrick Broden. Pasley start on amoxicillin-clavulanate. I am also having him maintain his Multiple Vitamin (MULTIVITAMIN ADULT PO), losartan-hydrochlorothiazide, fluconazole, acyclovir ointment, predniSONE, and triamcinolone cream.  Meds ordered this encounter  Medications   amoxicillin-clavulanate (AUGMENTIN) 875-125 MG tablet    Sig: Take 1 tablet by mouth 2 (two) times daily.    Dispense:  20 tablet    Refill:  0    Order Specific Question:   Supervising Provider    Answer:   Birdie Sons, ERIC G [[id number]]   I discussed the assessment and treatment plan with the patient. The patient was provided an opportunity to ask questions and all were answered. The patient agreed with the plan and demonstrated an understanding of the instructions.   The patient was advised to call back or seek an in-person evaluation if the symptoms worsen or if the condition fails to improve as anticipated.   Bethanie Dicker, NP [facility] [phone] (phone) [phone] (fax)  [facility]

## 2023-05-25 NOTE — Assessment & Plan Note (Addendum)
They present with a 5-day history of sinus congestion, headaches, and pressure behind the eyes and cheekbones, without fever, cough, or shortness of breath, and no exposure to COVID or flu. We will prescribe Augmentin 875 BID x 10 days and advise continuing Tylenol as needed for pain. Maintaining adequate hydration and taking a probiotic or eating yogurt to counter potential antibiotic side effects are recommended. Return precautions given to patient.

## 2023-05-26 ENCOUNTER — Ambulatory Visit: Admission: RE | Admit: 2023-05-26 | Payer: BC Managed Care – PPO | Source: Ambulatory Visit

## 2023-05-29 ENCOUNTER — Telehealth: Payer: Self-pay | Admitting: Family Medicine

## 2023-05-29 NOTE — Telephone Encounter (Signed)
Lft pt vm with the number to call to resch [place] at [phone] press option 4 and then 2.

## 2023-06-29 ENCOUNTER — Ambulatory Visit: Payer: Self-pay | Admitting: General Practice

## 2023-06-29 ENCOUNTER — Telehealth: Payer: BC Managed Care – PPO | Admitting: Physician Assistant

## 2023-06-29 DIAGNOSIS — J019 Acute sinusitis, unspecified: Secondary | ICD-10-CM

## 2023-06-29 DIAGNOSIS — B9689 Other specified bacterial agents as the cause of diseases classified elsewhere: Secondary | ICD-10-CM | POA: Diagnosis not present

## 2023-06-29 MED ORDER — DOXYCYCLINE HYCLATE 100 MG PO TABS: 100.0000 mg | ORAL_TABLET | Freq: Two times a day (BID) | ORAL | 0 refills | Status: DC

## 2023-06-29 NOTE — Patient Instructions (Signed)
 Kalub D Kleve, thank you for joining Elsie Velma Lunger, PA-C for today's virtual visit.  While this provider is not your primary care provider (PCP), if your PCP is located in our provider database this encounter information will be shared with them immediately following your visit.   A [facility] MyChart account gives you access to today's visit and all your visits, tests, and labs performed at [facility]  click here if you don't have a  MyChart account or go to [email or web address]  Consent: (Patient) Jentry Warnell [facility] provided verbal consent for this virtual visit at the beginning of the encounter.  Current Medications:  Current Outpatient Medications:    acyclovir  ointment (ZOVIRAX ) 5 %, Apply 1 Application topically every 6 (six) hours as needed., Disp: 30 g, Rfl: 11   amoxicillin -clavulanate (AUGMENTIN ) 875-125 MG tablet, Take 1 tablet by mouth 2 (two) times daily., Disp: 20 tablet, Rfl: 0   fluconazole  (DIFLUCAN ) 150 MG tablet, Take 1 tablet (150 mg total) by mouth once a week. X2-4 weeks, Disp: 4 tablet, Rfl: 0   losartan -hydrochlorothiazide  (HYZAAR) 100-25 MG tablet, TAKE 1 TABLET BY MOUTH EVERY DAY IN THE MORNING, Disp: 90 tablet, Rfl: 3   Multiple Vitamin (MULTIVITAMIN ADULT PO), Take by mouth., Disp: , Rfl:    predniSONE  (DELTASONE ) 20 MG tablet, Take 2 tablets (40 mg total) by mouth daily with breakfast., Disp: 10 tablet, Rfl: 0   triamcinolone  cream (KENALOG ) 0.1 %, Apply 1 Application topically 2 (two) times daily., Disp: 30 g, Rfl: 0   Medications ordered in this encounter:  No orders of the defined types were placed in this encounter.    *If you need refills on other medications prior to your next appointment, please contact your pharmacy*  Follow-Up: Call back or seek an in-person evaluation if the symptoms worsen or if the condition fails to improve as anticipated.  [facility] [phone]  Other Instructions Please  take antibiotic as directed.  Increase fluid intake.  Use Saline nasal spray.  Take a daily multivitamin. Continue your nasal sprays. Mucinex as discussed.  Place a humidifier in the bedroom.  Please call or return clinic if symptoms are not improving.  Sinusitis Sinusitis is redness, soreness, and swelling (inflammation) of the paranasal sinuses. Paranasal sinuses are air pockets within the bones of your face (beneath the eyes, the middle of the forehead, or above the eyes). In healthy paranasal sinuses, mucus is able to drain out, and air is able to circulate through them by way of your nose. However, when your paranasal sinuses are inflamed, mucus and air can become trapped. This can allow bacteria and other germs to grow and cause infection. Sinusitis can develop quickly and last only a short time (acute) or continue over a long period (chronic). Sinusitis that lasts for more than 12 weeks is considered chronic.  CAUSES  Causes of sinusitis include: Allergies. Structural abnormalities, such as displacement of the cartilage that separates your nostrils (deviated septum), which can decrease the air flow through your nose and sinuses and affect sinus drainage. Functional abnormalities, such as when the small hairs (cilia) that line your sinuses and help remove mucus do not work properly or are not present. SYMPTOMS  Symptoms of acute and chronic sinusitis are the same. The primary symptoms are pain and pressure around the affected sinuses. Other symptoms include: Upper toothache. Earache. Headache. Bad breath. Decreased sense of smell and taste. A cough, which worsens when you are lying flat. Fatigue. Fever. Thick  drainage from your nose, which often is green and may contain pus (purulent). Swelling and warmth over the affected sinuses. DIAGNOSIS  Your caregiver will perform a physical exam. During the exam, your caregiver may: Look in your nose for signs of abnormal growths in your nostrils  (nasal polyps). Tap over the affected sinus to check for signs of infection. View the inside of your sinuses (endoscopy) with a special imaging device with a light attached (endoscope), which is inserted into your sinuses. If your caregiver suspects that you have chronic sinusitis, one or more of the following tests may be recommended: Allergy tests. Nasal culture A sample of mucus is taken from your nose and sent to a lab and screened for bacteria. Nasal cytology A sample of mucus is taken from your nose and examined by your caregiver to determine if your sinusitis is related to an allergy. TREATMENT  Most cases of acute sinusitis are related to a viral infection and will resolve on their own within 10 days. Sometimes medicines are prescribed to help relieve symptoms (pain medicine, decongestants, nasal steroid sprays, or saline sprays).  However, for sinusitis related to a bacterial infection, your caregiver will prescribe antibiotic medicines. These are medicines that will help kill the bacteria causing the infection.  Rarely, sinusitis is caused by a fungal infection. In theses cases, your caregiver will prescribe antifungal medicine. For some cases of chronic sinusitis, surgery is needed. Generally, these are cases in which sinusitis recurs more than 3 times per year, despite other treatments. HOME CARE INSTRUCTIONS  Drink plenty of water. Water helps thin the mucus so your sinuses can drain more easily. Use a humidifier. Inhale steam 3 to 4 times a day (for example, sit in the bathroom with the shower running). Apply a warm, moist washcloth to your face 3 to 4 times a day, or as directed by your caregiver. Use saline nasal sprays to help moisten and clean your sinuses. Take over-the-counter or prescription medicines for pain, discomfort, or fever only as directed by your caregiver. SEEK IMMEDIATE MEDICAL CARE IF: You have increasing pain or severe headaches. You have nausea, vomiting, or  drowsiness. You have swelling around your face. You have vision problems. You have a stiff neck. You have difficulty breathing. MAKE SURE YOU:  Understand these instructions. Will watch your condition. Will get help right away if you are not doing well or get worse. Document Released: 06/06/2005 Document Revised: 08/29/2011 Document Reviewed: 06/21/2011 [facility] Patient Information 2014 [place], [place].    If you have been instructed to have an in-person evaluation today at a local Urgent Care facility, please use the link below. It will take you to a list of all of our available [facility], including address, phone number and hours of operation. Please do not delay care.  [facility]  If you or a family member do not have a primary care provider, use the link below to schedule a visit and establish care. When you choose a [facility] primary care physician or advanced practice provider, you gain a long-term partner in health. Find a Primary Care Provider  Learn more about [place]'s in-office and virtual care options: [facility] - Get Care Now

## 2023-06-29 NOTE — Progress Notes (Signed)
 Virtual Visit Consent   Alphonsa D [facility], you are scheduled for a virtual visit with a [place] provider today. Just as with appointments in the office, your consent must be obtained to participate. Your consent will be active for this visit and any virtual visit you may have with one of our providers in the next 365 days. If you have a MyChart account, a copy of this consent can be sent to you electronically.  As this is a virtual visit, video technology does not allow for your provider to perform a traditional examination. This may limit your provider's ability to fully assess your condition. If your provider identifies any concerns that need to be evaluated in person or the need to arrange testing (such as labs, EKG, etc.), we will make arrangements to do so. Although advances in technology are sophisticated, we cannot ensure that it will always work on either your end or our end. If the connection with a video visit is poor, the visit may have to be switched to a telephone visit. With either a video or telephone visit, we are not always able to ensure that we have a secure connection.  By engaging in this virtual visit, you consent to the provision of healthcare and authorize for your insurance to be billed (if applicable) for the services provided during this visit. Depending on your insurance coverage, you may receive a charge related to this service.  I need to obtain your verbal consent now. Are you willing to proceed with your visit today? Jonathan Blackwell has provided verbal consent on 06/29/2023 for a virtual visit (video or telephone). Jonathan Blackwell, [place]  Date: 06/29/2023 12:18 PM  Virtual Visit via Video Note   I, Jonathan Blackwell, connected with  Jonathan Blackwell  ([id number], [birth year]) on 06/29/23 at 12:15 PM EST by a video-enabled telemedicine application and verified that I am speaking with the correct person using two identifiers.  Location: Patient: Virtual Visit  Location Patient: Home Provider: Virtual Visit Location Provider: Home Office   I discussed the limitations of evaluation and management by telemedicine and the availability of in person appointments. The patient expressed understanding and agreed to proceed.    History of Present Illness: Jonathan Blackwell is a 63 y.o. who identifies as a male who was assigned male at birth, and is being seen today for recurrence of sinus symptoms. Endorses nasal congestion, sinus pressure, cough, fatigue. Was evaluated 05/25/2023 and started on 10-day course of Augmentin . Notes symptoms mostly but not fully resolved. Over the past several days symptoms progressing again with increased congestion, drainage, nasal discharge. Denies chest pain.   OTC -- Tylenol   HPI: HPI  Problems:  Patient Active Problem List   Diagnosis Date Noted   Acute non-recurrent maxillary sinusitis 05/25/2023   Cellulitis of left wrist 03/28/2022   COVID-19    Vitamin D  deficiency 04/13/2020   Prediabetes 04/13/2020   History of ITP 04/13/2020   Acute right-sided low back pain without sciatica 04/10/2020   Mid back pain 04/10/2020   Oral herpes 04/10/2020   Obesity (BMI 30-39.9) 10/11/2017   Abnormal liver function test 10/11/2017   Finger pain, right 10/11/2017   Tinea corporis 10/11/2017   Annual physical exam 10/11/2017   Fatty liver 09/28/2017   HLD (hyperlipidemia) 04/05/2013   Essential hypertension 02/05/2009    Allergies:  Allergies  Allergen Reactions   Hydrocodone-Chlorpheniramine Itching   Medications:  Current Outpatient Medications:    doxycycline  (VIBRA -TABS) 100 MG tablet,  Take 1 tablet (100 mg total) by mouth 2 (two) times daily., Disp: 20 tablet, Rfl: 0   acyclovir  ointment (ZOVIRAX ) 5 %, Apply 1 Application topically every 6 (six) hours as needed., Disp: 30 g, Rfl: 11   fluconazole  (DIFLUCAN ) 150 MG tablet, Take 1 tablet (150 mg total) by mouth once a week. X2-4 weeks, Disp: 4 tablet, Rfl: 0    losartan -hydrochlorothiazide  (HYZAAR) 100-25 MG tablet, TAKE 1 TABLET BY MOUTH EVERY DAY IN THE MORNING, Disp: 90 tablet, Rfl: 3   Multiple Vitamin (MULTIVITAMIN ADULT PO), Take by mouth., Disp: , Rfl:    triamcinolone  cream (KENALOG ) 0.1 %, Apply 1 Application topically 2 (two) times daily., Disp: 30 g, Rfl: 0  Observations/Objective: Patient is well-developed, well-nourished in no acute distress.  Resting comfortably at home.  Head is normocephalic, atraumatic.  No labored breathing. Speech is clear and coherent with logical content.  Patient is alert and oriented at baseline.   Assessment and Plan: 1. Acute bacterial sinusitis (Primary) - doxycycline  (VIBRA -TABS) 100 MG tablet; Take 1 tablet (100 mg total) by mouth 2 (two) times daily.  Dispense: 20 tablet; Refill: 0  Rx Doxycycline .  Increase fluids.  Rest.  Saline nasal spray.  Probiotic.  Mucinex as directed.  Humidifier in bedroom. Continue nasal steroid spray OTC.  Call or return to clinic if symptoms are not improving.   Follow Up Instructions: I discussed the assessment and treatment plan with the patient. The patient was provided an opportunity to ask questions and all were answered. The patient agreed with the plan and demonstrated an understanding of the instructions.  A copy of instructions were sent to the patient via MyChart unless otherwise noted below.   The patient was advised to call back or seek an in-person evaluation if the symptoms worsen or if the condition fails to improve as anticipated.    Jonathan Velma Lunger, PA-C

## 2023-06-29 NOTE — Telephone Encounter (Signed)
 Copied from CRM [id number]. Topic: Clinical - Medical Advice >> Jun 29, 2023 10:47 AM Thersia BROCKS wrote: Reason for CRM: Patient called in diagnose with a sinus infection a couple of weeks ago, took the antibiotics and now it is back, no sooner appointments anytime son. Will be going out of town next week is requesting a callback on what to do and if someone could prescribe him something  Chief Complaint: Sinus pain and congestion Symptoms: Sinus pressure, nasal discharge, cough, and HA Frequency: 3 days Pertinent Negatives: Patient denies relief Disposition: [] ED /[x] Urgent Care (no appt availability in office) / [] Appointment(In office/virtual)/ []  [place] [facility]/ [] Home Care/ [] Refused Recommended Disposition /[] [place] [facility]/ []  Follow-up with PCP Additional Notes: Patient called in reporting sinus symptoms. Patient stated he has been experiencing sinus pressure, congestion, cough, and headache for 3 days. Patient stated he is also experiencing moderate pain behind his eyes. Patient denied fever. Patient had a recent sinus infection in November. Patient is currently using Tylenol  to manage symptoms. This RN advised patient to try a nasal decongestant as well. This RN advised appointment within 3 days. Patient opted for virtual UC today due to snow that is supposed to hit tomorrow. This RN scheduled patient for virtual UC today. This RN advised patient to call back for worsening symptoms or additional question.   Reason for Disposition  [1] Using nasal washes and pain medicine > 24 hours AND [2] sinus pain (around cheekbone or eye) persists  Answer Assessment - Initial Assessment Questions 1. LOCATION: Where does it hurt?      Pressure behind eyes  2. ONSET: When did the sinus pain start?  (e.g., hours, days)      Started again 3 days ago  3. SEVERITY: How bad is the pain?   (Scale 1-10; mild, moderate or severe)   - MILD (1-3): doesn't interfere with normal activities     - MODERATE (4-7): interferes with normal activities (e.g., work or school) or awakens from sleep   - SEVERE (8-10): excruciating pain and patient unable to do any normal activities        Patient rates pain at a 6 or 7  4. RECURRENT SYMPTOM: Have you ever had sinus problems before? If Yes, ask: When was the last time? and What happened that time?      Yes, patient has sinus infection in November  5. NASAL CONGESTION: Is the nose blocked? If Yes, ask: Can you open it or must you breathe through your mouth?     Yes, patient states he needs to breath through his mouth  6. NASAL DISCHARGE: Do you have discharge from your nose? If so ask, What color?     Patient states discharge is clear during the day and yellow at night  7. FEVER: Do you have a fever? If Yes, ask: What is it, how was it measured, and when did it start?      Denies  8. OTHER SYMPTOMS: Do you have any other symptoms? (e.g., sore throat, cough, earache, difficulty breathing)     Patient complains of headache, cough, and stuffed up nose  Protocols used: Sinus Pain or Congestion-A-AH

## 2023-07-21 ENCOUNTER — Encounter: Payer: BC Managed Care – PPO | Admitting: Family Medicine

## 2023-07-24 ENCOUNTER — Other Ambulatory Visit: Payer: Self-pay | Admitting: General Practice

## 2023-07-24 ENCOUNTER — Ambulatory Visit: Payer: BC Managed Care – PPO | Admitting: Internal Medicine

## 2023-07-24 DIAGNOSIS — I10 Essential (primary) hypertension: Secondary | ICD-10-CM

## 2023-07-24 NOTE — Telephone Encounter (Signed)
Patient requested refill of losartan-hydrochlorothiazide. This RN reviewed the patient's chart and noted 3 remaining refills for this medication. This RN made an outbound call to advise the patient to call his pharmacy. Patient complied.   Copied from CRM [id number]. Topic: Clinical - Medication Refill >> Jul 24, 2023 11:53 AM Truddie Crumble wrote: Most Recent Primary Care Visit:  Provider: Bethanie Dicker  Department: [facility]  Visit Type: Earleen Reaper VIDEO VISIT  Date: 05/25/2023  Medication: losartan-hydrochlorothiazide   Has the patient contacted their pharmacy? No (Agent: If no, request that the patient contact the pharmacy for the refill. If patient does not wish to contact the pharmacy document the reason why and proceed with request.) (Agent: If yes, when and what did the pharmacy advise?)  Is this the correct pharmacy for this prescription? Yes If no, delete pharmacy and type the correct one.  This is the patient's preferred pharmacy:  [id number] - [address] [place] [address] Phone: [phone] Fax: [phone]   Has the prescription been filled recently? Yes  Is the patient out of the medication? No  Has the patient been seen for an appointment in the last year OR does the patient have an upcoming appointment? Yes  Can we respond through MyChart? Yes  Agent: Please be advised that Rx refills may take up to 3 business days. We ask that you follow-up with your pharmacy.

## 2023-07-24 NOTE — Telephone Encounter (Signed)
Please see if patient's appointment can be moved sooner. Unfortunately, I would need to have some BP data and some lab work before filling the medication.

## 2023-07-24 NOTE — Telephone Encounter (Signed)
Refill request for losartan-hydrochlorothiazide (HYZAAR) 100-25 MG tablet   LOV - 06/29/23 vv with Marcelline Mates Next OV - 08/22/23 Last refill - 02/11/22 #90/3 #90/3 by Dr. French Ana Mclean-Scocuzza (whom does not practice anymore)

## 2023-07-24 NOTE — Telephone Encounter (Signed)
Copied from CRM [id number]. Topic: Clinical - Medication Refill >> Jul 24, 2023 11:53 AM Truddie Crumble wrote: Most Recent Primary Care Visit:  Provider: Bethanie Dicker  Department: [facility]  Visit Type: Earleen Reaper VIDEO VISIT  Date: 05/25/2023  Medication: losartan-hydrochlorothiazide   Has the patient contacted their pharmacy? No (Agent: If no, request that the patient contact the pharmacy for the refill. If patient does not wish to contact the pharmacy document the reason why and proceed with request.) (Agent: If yes, when and what did the pharmacy advise?)  Is this the correct pharmacy for this prescription? Yes If no, delete pharmacy and type the correct one.  This is the patient's preferred pharmacy:  [id number] - [address] [place] [address] Phone: [phone] Fax: [phone]   Has the prescription been filled recently? Yes  Is the patient out of the medication? No  Has the patient been seen for an appointment in the last year OR does the patient have an upcoming appointment? Yes  Can we respond through MyChart? Yes  Agent: Please be advised that Rx refills may take up to 3 business days. We ask that you follow-up with your pharmacy.

## 2023-08-03 ENCOUNTER — Other Ambulatory Visit: Payer: Self-pay | Admitting: Medical Genetics

## 2023-08-22 ENCOUNTER — Encounter: Payer: BC Managed Care – PPO | Admitting: General Practice

## 2023-08-25 ENCOUNTER — Encounter: Admitting: Nurse Practitioner

## 2023-08-25 ENCOUNTER — Other Ambulatory Visit: Payer: Self-pay | Admitting: Nurse Practitioner

## 2023-08-25 DIAGNOSIS — I10 Essential (primary) hypertension: Secondary | ICD-10-CM

## 2023-08-25 NOTE — Telephone Encounter (Signed)
 Copied from CRM [id number]. Topic: Clinical - Medication Refill >> Aug 25, 2023 12:20 PM Ernst Spell wrote: Most Recent Primary Care Visit:  Provider: Bethanie Dicker  Department: [facility]  Visit Type: MYCHART VIDEO VISIT  Date: 05/25/2023  Medication: losartan  Has the patient contacted their pharmacy? Yes No more refills per pharmacy.    Is this the correct pharmacy for this prescription? Yes If no, delete pharmacy and type the correct one.  This is the patient's preferred pharmacy:  [id number] - [address] [place] [address] Phone: [phone] Fax: [phone]   Has the prescription been filled recently? Yes  Is the patient out of the medication? Yes  Has the patient been seen for an appointment in the last year OR does the patient have an upcoming appointment? Yes  Can we respond through MyChart? Yes  Agent: Please be advised that Rx refills may take up to 3 business days. We ask that you follow-up with your pharmacy.

## 2023-08-25 NOTE — Telephone Encounter (Signed)
 Last Fill: 02/11/22  Last OV: 05/25/23 Next OV: 08/28/23  Routing to provider for review/authorization.

## 2023-08-28 ENCOUNTER — Other Ambulatory Visit: Payer: Self-pay | Admitting: Nurse Practitioner

## 2023-08-28 ENCOUNTER — Encounter: Admitting: Internal Medicine

## 2023-08-28 ENCOUNTER — Ambulatory Visit: Payer: Self-pay | Admitting: General Practice

## 2023-08-28 ENCOUNTER — Telehealth: Payer: Self-pay

## 2023-08-28 ENCOUNTER — Encounter: Payer: Self-pay | Admitting: General Practice

## 2023-08-28 DIAGNOSIS — I10 Essential (primary) hypertension: Secondary | ICD-10-CM

## 2023-08-28 NOTE — Telephone Encounter (Signed)
 Copied from CRM [id number]. Topic: Clinical - Medication Refill >> Aug 28, 2023  2:46 PM Eunice Blase wrote: Most Recent Primary Care Visit:  Provider: Bethanie Dicker  Department: [facility]  Visit Type: Earleen Reaper VIDEO VISIT  Date: 05/25/2023  Medication: losartan-hydrochlorothiazide (HYZAAR) 100-25 MG tablet   Has the patient contacted their pharmacy? Yes (Agent: If no, request that the patient contact the pharmacy for the refill. If patient does not wish to contact the pharmacy document the reason why and proceed with request.) (Agent: If yes, when and what did the pharmacy advise?)Needs PCP approval  Is this the correct pharmacy for this prescription? Yes If no, delete pharmacy and type the correct one.  This is the patient's preferred pharmacy:  [id number] - [address] [place] [address] Phone: [phone] Fax: [phone]   Has the prescription been filled recently? Yes  Is the patient out of the medication? Yes  Has the patient been seen for an appointment in the last year OR does the patient have an upcoming appointment? Yes  Can we respond through MyChart? Yes  Agent: Please be advised that Rx refills may take up to 3 business days. We ask that you follow-up with your pharmacy.

## 2023-08-28 NOTE — Telephone Encounter (Signed)
 Please hold off until appt to send in the medication. See appt history. Also, this medication was last filled by Dr French Ana Mclean-Scocuzza on 02/11/22 for #90 with 3 refills. So he has been out since August 2024 based on our records.

## 2023-08-28 NOTE — Telephone Encounter (Signed)
 Copied from CRM [id number]. Topic: Clinical - Prescription Issue >> Aug 28, 2023  2:43 PM Eunice Blase wrote: Reason for CRM: Pt is out of medication losartan-hydrochlorothiazide (HYZAAR) 100-25 MG tablet and is scheduled for 09/05/2023.Please call pt [phone].  [facility] send to: [id number], [facility] - [address] [place] [address] Phone: [phone] Fax: [phone]

## 2023-08-28 NOTE — Telephone Encounter (Signed)
 Copied from CRM [id number]. Topic: Clinical - Red Word Triage >> Aug 28, 2023  1:13 PM Denese Killings wrote: [facility] that prompted transfer to Nurse Triage: Patient states that he is having symptoms from not being on his blood pressure medication. Symptoms- headaches and BP of 175/95 (states it had been fluctuating within the past 4 days since being out), vision blurry  Chief Complaint: Elevated BP Symptoms: Headache, blurred vision Frequency: Since Thursday Pertinent Negatives: Patient denies relief Disposition: [x] ED /[] Urgent Care (no appt availability in office) / [] Appointment(In office/virtual)/ []  [place] [facility]/ [] Home Care/ [x] Refused Recommended Disposition /[] [place] [facility]/ []  Follow-up with PCP Additional Notes: Patient called in to report that he has been without his losartan since last Thursday. Patient has a history of high BP and reported that he has been taking losartan for 15 years. Patient stated that he called to try and have the medication refilled last week. Patient stated he was instructed to call the pharmacy. Patient stated that the pharmacy instructed him to contact his PCP. Patient is now experiencing a BP of 175/90, blurred vision and headache. Patient stated that he has had over 10 cancellations/reschedules by the office. Patient is frustrated and stated that these cancellations/reschedules were not by him. This RN called the CAL for further clarification on what has been going on. This RN was advised that patient needed to be seen in the ED for his current symptoms. This RN advised ED, per protocol and CAL recommendation. Patient became frustrated with this RN and stated that he should not have to go to the ED when all he needs is his BP medication to be filled. This RN called the CAL to advise of ED refusal and because patient requested to speak with someone directly in the office. This RN was advised to route this conversation HP to the office and to instruct  the patient that someone from management would call him back. This RN relayed message to patient. Patient stated that he will be transferring care to a different office.    Reason for Disposition  [1] Systolic BP  >= 160 OR Diastolic >= 100 AND [2] cardiac (e.g., breathing difficulty, chest pain) or neurologic symptoms (e.g., new-onset blurred or double vision, unsteady gait)  Answer Assessment - Initial Assessment Questions 1. BLOOD PRESSURE: "What is the blood pressure?" "Did you take at least two measurements 5 minutes apart?"     175/90 about an hour ago 2. ONSET: "When did you take your blood pressure?"     About an hour ago 3. HOW: "How did you take your blood pressure?" (e.g., automatic home BP monitor, visiting nurse)     Taking BP at home 4. HISTORY: "Do you have a history of high blood pressure?"     History of high BP, taking losartan for 15 years 5. MEDICINES: "Are you taking any medicines for blood pressure?" "Have you missed any doses recently?"     Losartan, has not taken a dose since Thursday 6. OTHER SYMPTOMS: "Do you have any symptoms?" (e.g., blurred vision, chest pain, difficulty breathing, headache, weakness)     Blurred vision, headache  Protocols used: Blood Pressure - High-A-AH

## 2023-08-29 ENCOUNTER — Other Ambulatory Visit: Payer: Self-pay | Admitting: Internal Medicine

## 2023-08-29 ENCOUNTER — Telehealth: Payer: Self-pay

## 2023-08-29 ENCOUNTER — Telehealth: Payer: Self-pay | Admitting: *Deleted

## 2023-08-29 DIAGNOSIS — I10 Essential (primary) hypertension: Secondary | ICD-10-CM

## 2023-08-29 MED ORDER — LOSARTAN POTASSIUM-HCTZ 100-25 MG PO TABS: ORAL_TABLET | ORAL | 0 refills | Status: DC

## 2023-08-29 NOTE — Telephone Encounter (Signed)
 Copied from CRM [id number]. Topic: Clinical - Medication Question >> Aug 29, 2023  9:17 AM Elizebeth Brooking wrote: Reason for CRM: Patient called in stating status medication , informed him per note that the nurse stated that he will have to come in for an appointment before being scheduled his appointment is on the 18th. He stated that it will be to long without his medication, Called CAL for further assistance they stated that provider does not have any availabilities stated in the note that he can call the pharmacy to see if he could get some or go to a minute clinic, I informed the patient that he could go to a minute clinic in order to get his prescription he stated he what for? I informed him to get the medication that he needs. He stated he was not going to spend a 100 copay then patient stated if he could get his medical records so he could go somewhere else I got the number for medical records from CAL to provide to the patient . Patient then hung up . >> Aug 29, 2023 10:10 AM Morrie Sheldon D wrote: Patient called wanting to check on a medication for losartan and he very upset about him not getting his medication patient stated since last week he has been out and he has been calling since then. Patient stated it has been eight days since he had his medication. Patient stated if we cared about patients because it says we are right here with you and does not seem like. Patient stated he is not a new patient and it is not like he is getting a controlled substance . Patient stated he has been a patient for 20 years

## 2023-08-29 NOTE — Telephone Encounter (Signed)
 Patient called into office requesting to speak to manager, after identifying patient by name and DOB . Patient advised he was calling about his BP medication that patient has been out 7 days. Patient had had several TOC's appt but were canceled by providers for different reason. Patient has also canceled due to reason unknown. Patient was to come in for BP medication on 08/28/23 but was late for appointment and missed visit. Patient has OV scheduled for 08/30/23 with Dr. Heide Spark for refill until establish care visit with Dr. Charlann Lange on 09/25/23. Patient is asking for 1 tablet until appt Losartan-hydrochlorothiazide 100-25 MG Tablet until appt. I have pended request for lead provider approval.

## 2023-08-30 ENCOUNTER — Encounter: Payer: Self-pay | Admitting: Internal Medicine

## 2023-08-30 ENCOUNTER — Ambulatory Visit (INDEPENDENT_AMBULATORY_CARE_PROVIDER_SITE_OTHER): Admitting: Internal Medicine

## 2023-08-30 VITALS — BP 136/82 | HR 58 | Temp 98.7°F | Ht 72.0 in | Wt 231.2 lb

## 2023-08-30 DIAGNOSIS — I10 Essential (primary) hypertension: Secondary | ICD-10-CM | POA: Diagnosis not present

## 2023-08-30 NOTE — Progress Notes (Signed)
 Acute Office Visit  Subjective:     Patient ID: Jonathan Blackwell, male    DOB: [birth year], 62 y.o.   MRN: [id number]  Chief Complaint  Patient presents with   Medication Refill    Medication Refill Associated symptoms include headaches.   Patient is in today for follow-up of blood pressure.  Patient states that he ran out of his blood pressure medications about 1 week ago.  He has had difficulty scheduling his appointment through the call center so that he can get refills.  He states that his blood pressures over the last week have been elevated off his blood pressure medication and that they have for yesterday he had systolics in the 170s and diastolics in the 90s at home.  Yesterday, patient states that he had some headache and blurry vision.  Today morning, he took his blood pressure and his blood pressure was in the 160s over 80s.  He started taking his losartan/HCTZ again today with improvement and his systolics down to the 130s.  Review of Systems  Constitutional: Negative.   HENT: Negative.    Respiratory: Negative.    Cardiovascular: Negative.   Musculoskeletal: Negative.   Neurological:  Positive for headaches.       Blurry vision  Psychiatric/Behavioral: Negative.          Objective:    BP 136/82   Pulse (!) 58   Temp 98.7 F (37.1 C)   Ht 6' (1.829 m)   Wt 231 lb 3.2 oz (104.9 kg)   SpO2 99%   BMI 31.36 kg/m    Physical Exam Constitutional:      Appearance: Normal appearance.  HENT:     Head: Normocephalic and atraumatic.  Cardiovascular:     Rate and Rhythm: Normal rate and regular rhythm.     Heart sounds: Normal heart sounds.  Pulmonary:     Effort: No respiratory distress.     Breath sounds: Normal breath sounds. No wheezing.  Abdominal:     General: Bowel sounds are normal. There is no distension.     Palpations: Abdomen is soft.     Tenderness: There is no abdominal tenderness.  Neurological:     Mental Status: He is alert and oriented to  person, place, and time.     Motor: No weakness.     Gait: Gait normal.     Comments: Power 5 out of 5 in bilateral upper and lower extremities  Psychiatric:        Mood and Affect: Mood normal.        Behavior: Behavior normal.     No results found for any visits on 08/30/23.      Assessment & Plan:   Problem List Items Addressed This Visit       Cardiovascular and Mediastinum   Uncontrolled hypertension - Primary   -Patient presented today for refill of his antihypertensive medication -He states that over the last week after running out of his medication he has noticed elevated blood pressures with the highest being 170s over 90s 2 days ago. -Yesterday, patient did not check his blood pressure but he had headaches and blurry vision.  Patient states this improved after waking up this morning but is still elevated blood pressures in the 160s over 80s -He has been having difficulty obtaining an appointment for refill.  Yesterday, Dr. Darrick Huntsman called and a refill of his losartan/HCTZ and patient started taking it this morning -Blood pressure in the office today was 136/82 -  We talked about controlling the salt in his diet, continuing exercise and losing weight as well as taking his medication regularly -He will follow-up with his new PCP in April and get blood work done at that time including a BMP -No further workup at this time       No orders of the defined types were placed in this encounter.   No follow-ups on file.  Earl Lagos, MD

## 2023-08-30 NOTE — Patient Instructions (Addendum)
-  It was a pleasure meeting you today  -Your blood pressure is improved on the current dose of losartan/hydrochlorothiazide at 100/25 mg -Please follow-up with your new physician in April -You will need blood work at that time to check your kidney function -Please contact us if you have any questions or concerns

## 2023-08-30 NOTE — Assessment & Plan Note (Signed)
-  Patient presented today for refill of his antihypertensive medication -He states that over the last week after running out of his medication he has noticed elevated blood pressures with the highest being 170s over 90s 2 days ago. -Yesterday, patient did not check his blood pressure but he had headaches and blurry vision.  Patient states this improved after waking up this morning but is still elevated blood pressures in the 160s over 80s -He has been having difficulty obtaining an appointment for refill.  Yesterday, Dr. Darrick Huntsman called and a refill of his losartan/HCTZ and patient started taking it this morning -Blood pressure in the office today was 136/82 -We talked about controlling the salt in his diet, continuing exercise and losing weight as well as taking his medication regularly -He will follow-up with his new PCP in April and get blood work done at that time including a BMP -No further workup at this time

## 2023-09-05 ENCOUNTER — Encounter: Admitting: Nurse Practitioner

## 2023-09-20 ENCOUNTER — Other Ambulatory Visit: Payer: Self-pay | Admitting: Internal Medicine

## 2023-09-20 DIAGNOSIS — I10 Essential (primary) hypertension: Secondary | ICD-10-CM

## 2023-09-25 NOTE — Progress Notes (Unsigned)
   Established Patient Office Visit   Subjective  Patient ID: Jonathan Blackwell, male    DOB: 1961-05-17  Age: 63 y.o. MRN: [id number]  No chief complaint on file.   He  has a past medical history of Allergic rhinitis due to pollen, COVID-19, Fatty liver, HTN (hypertension), Hyperlipidemia, and ITP (idiopathic thrombocytopenic purpura).  HPI Established patient of Dr. French Ana McLean-Scocuzza  presenting for transfer of care.   1) Uncontrolled hypertension: Patient presented to clinic on 08/30/2023 and was seen well Dr. Heide Spark after you have a mild office antihypertensive medication.  Patient currently on losartan hydrochlorothiazide, needs refill and labs.   2) Recommend annual physical. Due for colorectal cancer screening.   3) Last HbA1C: 03/30/22 was 5.7%, per ADA prediabetes.   ROS As per HPI    Objective:     There were no vitals taken for this visit. {Vitals History (Optional):23777}  Physical Exam   No results found for any visits on 09/26/23.  The 10-year ASCVD risk score (Arnett DK, et al., 2019) is: 11.6%    Assessment & Plan:  Essential hypertension  Hypertension, unspecified type  Prediabetes  Thyroid disorder screen  Screening for prostate cancer    No follow-ups on file.   Skip Estimable, MD

## 2023-09-26 ENCOUNTER — Ambulatory Visit

## 2023-09-26 VITALS — BP 140/98 | HR 54 | Temp 98.6°F | Ht 72.0 in | Wt 235.0 lb

## 2023-09-26 DIAGNOSIS — I10 Essential (primary) hypertension: Secondary | ICD-10-CM | POA: Diagnosis not present

## 2023-09-26 DIAGNOSIS — E559 Vitamin D deficiency, unspecified: Secondary | ICD-10-CM

## 2023-09-26 DIAGNOSIS — Z6831 Body mass index (BMI) 31.0-31.9, adult: Secondary | ICD-10-CM | POA: Diagnosis not present

## 2023-09-26 DIAGNOSIS — Z1329 Encounter for screening for other suspected endocrine disorder: Secondary | ICD-10-CM

## 2023-09-26 DIAGNOSIS — R7989 Other specified abnormal findings of blood chemistry: Secondary | ICD-10-CM

## 2023-09-26 DIAGNOSIS — Z125 Encounter for screening for malignant neoplasm of prostate: Secondary | ICD-10-CM

## 2023-09-26 DIAGNOSIS — R7303 Prediabetes: Secondary | ICD-10-CM

## 2023-09-26 DIAGNOSIS — Z862 Personal history of diseases of the blood and blood-forming organs and certain disorders involving the immune mechanism: Secondary | ICD-10-CM | POA: Diagnosis not present

## 2023-09-26 DIAGNOSIS — E782 Mixed hyperlipidemia: Secondary | ICD-10-CM

## 2023-09-26 DIAGNOSIS — L608 Other nail disorders: Secondary | ICD-10-CM | POA: Insufficient documentation

## 2023-09-26 DIAGNOSIS — L989 Disorder of the skin and subcutaneous tissue, unspecified: Secondary | ICD-10-CM

## 2023-09-26 LAB — COMPREHENSIVE METABOLIC PANEL WITH GFR
ALT: 29 U/L (ref 0–53)
AST: 17 U/L (ref 0–37)
Albumin: 4.6 g/dL (ref 3.5–5.2)
Alkaline Phosphatase: 63 U/L (ref 39–117)
BUN: 14 mg/dL (ref 6–23)
CO2: 28 meq/L (ref 19–32)
Calcium: 9.4 mg/dL (ref 8.4–10.5)
Chloride: 103 meq/L (ref 96–112)
Creatinine, Ser: 0.97 mg/dL (ref 0.40–1.50)
GFR: 84.24 mL/min (ref >60.00)
Glucose, Bld: 97 mg/dL (ref 70–99)
Potassium: 3.7 meq/L (ref 3.5–5.1)
Sodium: 140 meq/L (ref 135–145)
Total Bilirubin: 0.9 mg/dL (ref 0.2–1.2)
Total Protein: 7.1 g/dL (ref 6.0–8.3)

## 2023-09-26 LAB — LIPID PANEL
Cholesterol: 210 mg/dL — ABNORMAL HIGH (ref 0–200)
HDL: 44.6 mg/dL (ref >39.00)
LDL Cholesterol: 146 mg/dL — ABNORMAL HIGH (ref 0–99)
NonHDL: 164.94
Total CHOL/HDL Ratio: 5
Triglycerides: 97 mg/dL (ref 0.0–149.0)
VLDL: 19.4 mg/dL (ref 0.0–40.0)

## 2023-09-26 LAB — CBC WITH DIFFERENTIAL/PLATELET
Basophils Absolute: 0 10*3/uL (ref 0.0–0.1)
Basophils Relative: 0.7 % (ref 0.0–3.0)
Eosinophils Absolute: 0.2 10*3/uL (ref 0.0–0.7)
Eosinophils Relative: 3.9 % (ref 0.0–5.0)
HCT: 45.3 % (ref 39.0–52.0)
Hemoglobin: 15.1 g/dL (ref 13.0–17.0)
Lymphocytes Relative: 29.5 % (ref 12.0–46.0)
Lymphs Abs: 1.6 10*3/uL (ref 0.7–4.0)
MCHC: 33.4 g/dL (ref 30.0–36.0)
MCV: 92 fl (ref 78.0–100.0)
Monocytes Absolute: 0.4 10*3/uL (ref 0.1–1.0)
Monocytes Relative: 8.1 % (ref 3.0–12.0)
Neutro Abs: 3.1 10*3/uL (ref 1.4–7.7)
Neutrophils Relative %: 57.8 % (ref 43.0–77.0)
Platelets: 164 10*3/uL (ref 150.0–400.0)
RBC: 4.92 Mil/uL (ref 4.22–5.81)
RDW: 13.3 % (ref 11.5–15.5)
WBC: 5.3 10*3/uL (ref 4.0–10.5)

## 2023-09-26 LAB — HEMOGLOBIN A1C: Hgb A1c MFr Bld: 5.6 % (ref 4.6–6.5)

## 2023-09-26 LAB — MICROALBUMIN / CREATININE URINE RATIO
Creatinine,U: 26.8 mg/dL
Microalb Creat Ratio: UNDETERMINED mg/g (ref 0.0–30.0)
Microalb, Ur: <0.7 mg/dL

## 2023-09-26 LAB — TSH: TSH: 2.88 u[IU]/mL (ref 0.35–5.50)

## 2023-09-26 LAB — VITAMIN D 25 HYDROXY (VIT D DEFICIENCY, FRACTURES): VITD: 26.78 ng/mL — ABNORMAL LOW (ref 30.00–100.00)

## 2023-09-26 MED ORDER — LOSARTAN POTASSIUM-HCTZ 100-25 MG PO TABS: ORAL_TABLET | ORAL | 1 refills | Status: DC

## 2023-09-26 NOTE — Patient Instructions (Addendum)
 1) Check BP three times a week and bring your BP reading during your follow up appointment. If BP continues to be over 130/80 mmHg we will add a new medication on top of what you are taking.   2) Moderate intensity exercise 30 minutes for 5 days a week. Exercise, Mediterranean diet can help improve your cholesterol and blood pressure.   3) Dermatology referral was done today.   4) Recommend follow up in 4-6 weeks for annual physical and BP recheck.

## 2023-09-26 NOTE — Assessment & Plan Note (Signed)
 Not controlled.  Counseled on importance of medication compliance, risk related to uncontrolled blood pressure including stroke, MI.  Counseled on checking blood pressure 3 times a week over the next 4 weeks with goal blood pressure of less than 130 x 80 mmHg.  Will add amlodipine DP and 2.5 mg with current regiment if blood pressure still not controlled.  Check TSH, CBC, CMP, urine microalbumin today.  Refill sent for Losartan-hydrochlorothiazide 100-25 mg, take 1 tab daily.

## 2023-09-26 NOTE — Assessment & Plan Note (Signed)
 Vitamin D insufficiency: Lab from 11/18/20 showed vitamin D to be 25.15. Patient reports he is taking multivitamin daily.  Repeat Vitamin D level.

## 2023-09-26 NOTE — Assessment & Plan Note (Signed)
 Lifestyle modification including moderate intensity exercise, low carbohydrate, Mediterranean like diet discussed.

## 2023-09-26 NOTE — Assessment & Plan Note (Signed)
 HbA1C: 03/30/22 was 5.7%, per ADA prediabetes. Lifestyle modification discussed. Recommend repeat HbA1c today.

## 2023-09-26 NOTE — Assessment & Plan Note (Signed)
 Reviewed patient's previous CMP. Mildly elevated total bilirubin 04/23/21 (1.4 mg/dl), ALT elevated 6/0/45 (63 u/l). No right upper quadrant pain.  Recheck CMP today.

## 2023-09-26 NOTE — Addendum Note (Signed)
 Addended by: Skip Estimable on: 09/26/2023 10:36 AM   Modules accepted: Level of Service

## 2023-09-26 NOTE — Assessment & Plan Note (Signed)
 Check CBC today. No purpura, bleeding.

## 2023-09-26 NOTE — Assessment & Plan Note (Signed)
Dermatology referral made today. 

## 2023-09-26 NOTE — Assessment & Plan Note (Signed)
 The 10-year ASCVD risk score (Arnett DK, et al., 2019) is: 12.1%. Discussed lifestyle and pharmacological intervention.  Recommend starting moderate intensity statin.  Patient hesitant on pharmacological intervention at this time.  Recheck lipid panel today.  If patient continues to have elevated LDL, 10-year ASCVD risk over 10% despite lifestyle modification over the next 6 weeks recommend starting him on rosuvastatin 5 mg nightly.  Lab ordered.  Patient was provided resources on Mediterranean like diet.

## 2023-09-26 NOTE — Assessment & Plan Note (Addendum)
 Single, inflamed seborrheic keratosis on left arm.  Dermatology referral made today.

## 2023-10-25 ENCOUNTER — Telehealth: Payer: Self-pay

## 2023-10-25 ENCOUNTER — Ambulatory Visit (INDEPENDENT_AMBULATORY_CARE_PROVIDER_SITE_OTHER)

## 2023-10-25 VITALS — BP 130/84 | HR 63 | Temp 98.6°F | Ht 72.0 in | Wt 225.6 lb

## 2023-10-25 DIAGNOSIS — E782 Mixed hyperlipidemia: Secondary | ICD-10-CM

## 2023-10-25 DIAGNOSIS — I10 Essential (primary) hypertension: Secondary | ICD-10-CM

## 2023-10-25 DIAGNOSIS — M9905 Segmental and somatic dysfunction of pelvic region: Secondary | ICD-10-CM | POA: Diagnosis not present

## 2023-10-25 DIAGNOSIS — M62830 Muscle spasm of back: Secondary | ICD-10-CM | POA: Diagnosis not present

## 2023-10-25 DIAGNOSIS — Z0000 Encounter for general adult medical examination without abnormal findings: Secondary | ICD-10-CM

## 2023-10-25 DIAGNOSIS — M9903 Segmental and somatic dysfunction of lumbar region: Secondary | ICD-10-CM | POA: Diagnosis not present

## 2023-10-25 DIAGNOSIS — M9902 Segmental and somatic dysfunction of thoracic region: Secondary | ICD-10-CM | POA: Diagnosis not present

## 2023-10-25 NOTE — Progress Notes (Signed)
 Established Patient Office Visit   Subjective  Patient ID: Jonathan Blackwell, male    DOB: 06/11/1961  Age: 63 y.o. MRN: [id number]  Chief Complaint  Patient presents with   Annual Exam    He  has a past medical history of Acute non-recurrent maxillary sinusitis (05/25/2023), Allergic rhinitis due to pollen, Allergy (2000), Cellulitis of left wrist (03/28/2022), COVID-19, Fatty liver, HTN (hypertension), Hyperlipidemia, and ITP (idiopathic thrombocytopenic purpura).  HPI Patient presents for annual physical exam and follow up on hypertension.   - Essential hypertension: Patient was seen by me on 09/26/23, his BP at the time was 140/98 mmHg. Since his OV patient has lost about 10 pounds weight, is taking Losartan -hydrochlorothiazide  100-25 mg daily. He is eating healthy.  His home BP has been mostly ranging in SBP of 130s mmHg and DBP in 70s mmHg except for one reading of DBP around 90 mmHg.   - Patient was referred to see dermatology for for skin lesion on left arm which is significantly better today. He has not gotten a call from dermatology yet.   - Annual physical: Due for colorectal cancer, patient was scheduled for a screening colonoscopy but he was told it was going to cost him $600 out of pocket. He chose not to do screening colonoscopy at this time. He plans on reaching out to his insurance for more clarification.   - Mixed hyperlipidemia: LDL on 09/26/23 was 146. He is not interested in starting medication to lower lipid yet.   - Vitamin D  insuffiencey: Currently taking 1,000 international units daily.   ROS As per HPI     Objective:     BP 130/84   Pulse 63   Temp 98.6 F (37 C) (Oral)   Ht 6' (1.829 m)   Wt 225 lb 9.6 oz (102.3 kg)   SpO2 98%   BMI 30.60 kg/m      10/25/2023    1:08 PM 09/26/2023    9:29 AM 08/30/2023    3:12 PM  Depression screen PHQ 2/9  Decreased Interest 0 0 0  Down, Depressed, Hopeless 0 0 0  PHQ - 2 Score 0 0 0  Altered sleeping 0 0 0   Tired, decreased energy 0 0 0  Change in appetite 0 0 0  Feeling bad or failure about yourself  0 0 0  Trouble concentrating 0 0 0  Moving slowly or fidgety/restless 0 0 0  Suicidal thoughts 0 0 0  PHQ-9 Score 0 0 0  Difficult doing work/chores Not difficult at all  Not difficult at all      10/25/2023    1:08 PM 09/26/2023    9:29 AM 08/30/2023    3:12 PM 05/16/2023    1:23 PM  GAD 7 : Generalized Anxiety Score  Nervous, Anxious, on Edge 0 0 0 0  Control/stop worrying 0 0 0 0  Worry too much - different things 0 0 0 0  Trouble relaxing 0 0 0 0  Restless 0 0 0 0  Easily annoyed or irritable 0 0 0 0  Afraid - awful might happen 0 0 0 0  Total GAD 7 Score 0 0 0 0  Anxiety Difficulty Not difficult at all  Not difficult at all Not difficult at all    Physical Exam Constitutional:      Appearance: Normal appearance.  HENT:     Head: Normocephalic and atraumatic.     Right Ear: Tympanic membrane normal.  Left Ear: Tympanic membrane normal.     Mouth/Throat:     Mouth: Mucous membranes are moist.  Neck:     Thyroid: No thyroid mass or thyroid tenderness.  Cardiovascular:     Rate and Rhythm: Normal rate and regular rhythm.  Pulmonary:     Effort: Pulmonary effort is normal.     Breath sounds: Normal breath sounds.  Abdominal:     General: Bowel sounds are normal.     Palpations: Abdomen is soft.  Musculoskeletal:     Cervical back: Neck supple. No rigidity.     Right lower leg: No edema.     Left lower leg: No edema.  Skin:    General: Skin is warm.     Comments: Inflamed SK of left forearm significantly improved.    Neurological:     Mental Status: He is alert and oriented to person, place, and time.  Psychiatric:        Mood and Affect: Mood normal.        Behavior: Behavior normal.        No results found for any visits on 10/25/23.  The 10-year ASCVD risk score (Arnett DK, et al., 2019) is: 12.6%     Assessment & Plan:  Annual physical  exam Assessment & Plan: Due for colorectal cancer screening, patient will reach out to his insurance for more clarification on coverage for colonoscopy. Not interested in cologuard at this time. Patient will update us  once he talks with insurance.    Essential hypertension Assessment & Plan: Goal BP <130/80 mmHg, continue home BP monitoring, continue Losartan -hydrochlorothiazide  100 mg-25 mg daily.   Patient is working on lifestyle modifications, since his last visit he has lost about 10 pounds. If at home BP >130/80 mmHg consistently I recommend patient reached out to us . Will consider adding CCB to current treatment.    Mixed hyperlipidemia Assessment & Plan: Lipid Panel     Component Value Date/Time   CHOL 210 (H) 09/26/2023 1012   TRIG 97.0 09/26/2023 1012   HDL 44.60 09/26/2023 1012   CHOLHDL 5 09/26/2023 1012   VLDL 19.4 09/26/2023 1012   LDLCALC 146 (H) 09/26/2023 1012   LDLDIRECT 154.2 04/01/2013 0819   The 10-year ASCVD risk score (Arnett DK, et al., 2019) is: 12.6%   Values used to calculate the score:     Age: 66 years     Sex: Male     Is Non-Hispanic African American: No     Diabetic: No     Tobacco smoker: No     Systolic Blood Pressure: 130 mmHg     Is BP treated: Yes     HDL Cholesterol: 44.6 mg/dL     Total Cholesterol: 210 mg/dL  Counseled on risks of elevated LDL including r/o stroke, MI. Repeat fasting lipid panel after 1 week, if LDL >105, start Rosuvastatin 5 mg daily.   Orders: -     Lipid panel; Future    Return in about 1 week (around 11/01/2023) for 1 week or after that for lab only appointment .  F/U in 6 months for hypertension management.   Jacklin Mascot, MD

## 2023-10-25 NOTE — Assessment & Plan Note (Signed)
 Due for colorectal cancer screening, patient will reach out to his insurance for more clarification on coverage for colonoscopy. Not interested in cologuard at this time. Patient will update us  once he talks with insurance.

## 2023-10-25 NOTE — Assessment & Plan Note (Signed)
 Lipid Panel     Component Value Date/Time   CHOL 210 (H) 09/26/2023 1012   TRIG 97.0 09/26/2023 1012   HDL 44.60 09/26/2023 1012   CHOLHDL 5 09/26/2023 1012   VLDL 19.4 09/26/2023 1012   LDLCALC 146 (H) 09/26/2023 1012   LDLDIRECT 154.2 04/01/2013 0819   The 10-year ASCVD risk score (Arnett DK, et al., 2019) is: 12.6%   Values used to calculate the score:     Age: 63 years     Sex: Male     Is Non-Hispanic African American: No     Diabetic: No     Tobacco smoker: No     Systolic Blood Pressure: 130 mmHg     Is BP treated: Yes     HDL Cholesterol: 44.6 mg/dL     Total Cholesterol: 210 mg/dL  Counseled on risks of elevated LDL including r/o stroke, MI. Repeat fasting lipid panel after 1 week, if LDL >105, start Rosuvastatin 5 mg daily.

## 2023-10-25 NOTE — Assessment & Plan Note (Signed)
 Goal BP <130/80 mmHg, continue home BP monitoring, continue Losartan -hydrochlorothiazide  100 mg-25 mg daily.   Patient is working on lifestyle modifications, since his last visit he has lost about 10 pounds. If at home BP >130/80 mmHg consistently I recommend patient reached out to us . Will consider adding CCB to current treatment.

## 2023-10-25 NOTE — Telephone Encounter (Signed)
 Patient was seen here in the office by Dr Casimir Cleaver. Dr Casimir Cleaver said that she sent over a Dermatology referral for the patient back in April and wanted to follow up as the patient has not heard back in regards to her referral.

## 2024-02-27 ENCOUNTER — Ambulatory Visit (INDEPENDENT_AMBULATORY_CARE_PROVIDER_SITE_OTHER): Admitting: Nurse Practitioner

## 2024-02-27 VITALS — BP 130/84 | HR 65 | Temp 97.9°F | Resp 17 | Ht 72.0 in | Wt 232.5 lb

## 2024-02-27 DIAGNOSIS — J0100 Acute maxillary sinusitis, unspecified: Secondary | ICD-10-CM

## 2024-02-27 MED ORDER — AZITHROMYCIN 250 MG PO TABS: ORAL_TABLET | ORAL | 0 refills | Status: AC

## 2024-02-27 MED ORDER — METHYLPREDNISOLONE 4 MG PO TBPK: ORAL_TABLET | ORAL | 0 refills | Status: AC

## 2024-02-27 NOTE — Progress Notes (Signed)
 Leron Glance, NP-C Phone: [phone]  Jonathan Blackwell is a 63 y.o. male who presents today for cough and congestion.   Discussed the use of AI scribe software for clinical note transcription with the patient, who gave verbal consent to proceed.  History of Present Illness   Jonathan Blackwell is a 63 year old male who presents with sinus congestion and related symptoms.  He has been experiencing sinus congestion for approximately one week, primarily located in his head and behind his eyes, with the left side being more affected. He also has difficulty breathing through his nose, especially on the left side, and a sensation of fullness in his ears.  He has a cough, which he describes as purposeful to clear his throat, but denies significant drainage in his throat. No sore throat, fever, or true shortness of breath, although he notes difficulty breathing through his nose. He denies nausea, vomiting, and body aches, attributing any fatigue to his sinus issues.  For symptom relief, he occasionally uses Tylenol  in the evenings and a sinus rinse product similar to a Medipod. He avoids over-the-counter medications like Mucinex, DayQuil, or NyQuil due to his blood pressure condition. In the past, he used azithromycin  and a low-dose prednisone  for similar symptoms, which he did not complete as he felt better.     Social History   Tobacco Use  Smoking Status Never  Smokeless Tobacco Never    Current Outpatient Medications on File Prior to Visit  Medication Sig Dispense Refill   acyclovir  ointment (ZOVIRAX ) 5 % Apply 1 Application topically every 6 (six) hours as needed. 30 g 11   losartan -hydrochlorothiazide  (HYZAAR) 100-25 MG tablet TAKE 1 TABLET BY MOUTH EVERY DAY IN THE MORNING 90 tablet 1   Multiple Vitamin (MULTIVITAMIN ADULT PO) Take by mouth.     No current facility-administered medications on file prior to visit.     ROS see history of present illness  Objective  Physical  Exam Vitals:   02/27/24 1125  BP: 130/84  Pulse: 65  Resp: 17  Temp: 97.9 F (36.6 C)  SpO2: 99%    BP Readings from Last 3 Encounters:  02/27/24 130/84  10/25/23 130/84  09/26/23 (!) 140/98   Wt Readings from Last 3 Encounters:  02/27/24 232 lb 8 oz (105.5 kg)  10/25/23 225 lb 9.6 oz (102.3 kg)  09/26/23 235 lb (106.6 kg)    Physical Exam Constitutional:      General: He is not in acute distress.    Appearance: Normal appearance.  HENT:     Head: Normocephalic.     Right Ear: Tympanic membrane normal.     Left Ear: Tympanic membrane normal.     Nose: Congestion present.     Left Sinus: Maxillary sinus tenderness present.     Mouth/Throat:     Mouth: Mucous membranes are moist.     Pharynx: Oropharynx is clear.  Eyes:     Conjunctiva/sclera: Conjunctivae normal.     Pupils: Pupils are equal, round, and reactive to light.  Cardiovascular:     Rate and Rhythm: Normal rate and regular rhythm.     Heart sounds: Normal heart sounds.  Pulmonary:     Effort: Pulmonary effort is normal.     Breath sounds: Normal breath sounds.  Lymphadenopathy:     Cervical: No cervical adenopathy.  Skin:    General: Skin is warm and dry.  Neurological:     General: No focal deficit present.     Mental  Status: He is alert.  Psychiatric:        Mood and Affect: Mood normal.        Behavior: Behavior normal.      Assessment/Plan: Please see individual problem list.  Acute non-recurrent maxillary sinusitis Assessment & Plan: Symptoms and duration consistent with sinusitis. Previous treatment with azithromycin  and prednisone  has been successful . Agreed to treatment due to persistent symptoms. Prescribe azithromycin  (Z-Pak) and methylprednisolone  dose pack. Advise adequate hydration. Continue nasal rinses and consider Flonase . Return precautions given to patient.  Orders: -     Azithromycin ; Take 2 tablets on day 1, then 1 tablet daily on days 2 through 5  Dispense: 6 tablet;  Refill: 0 -     methylPREDNISolone ; Take as directed.  Dispense: 21 each; Refill: 0      Return if symptoms worsen or fail to improve.   Leron Glance, NP-C [facility]

## 2024-03-13 ENCOUNTER — Encounter: Payer: Self-pay | Admitting: Nurse Practitioner

## 2024-03-13 NOTE — Assessment & Plan Note (Signed)
 Symptoms and duration consistent with sinusitis. Previous treatment with azithromycin  and prednisone  has been successful . Agreed to treatment due to persistent symptoms. Prescribe azithromycin  (Z-Pak) and methylprednisolone  dose pack. Advise adequate hydration. Continue nasal rinses and consider Flonase . Return precautions given to patient.

## 2024-04-04 DIAGNOSIS — M9905 Segmental and somatic dysfunction of pelvic region: Secondary | ICD-10-CM | POA: Diagnosis not present

## 2024-04-04 DIAGNOSIS — M62830 Muscle spasm of back: Secondary | ICD-10-CM | POA: Diagnosis not present

## 2024-04-04 DIAGNOSIS — M9903 Segmental and somatic dysfunction of lumbar region: Secondary | ICD-10-CM | POA: Diagnosis not present

## 2024-04-04 DIAGNOSIS — M9902 Segmental and somatic dysfunction of thoracic region: Secondary | ICD-10-CM | POA: Diagnosis not present

## 2024-04-16 ENCOUNTER — Other Ambulatory Visit: Payer: Self-pay | Admitting: Medical Genetics

## 2024-04-16 DIAGNOSIS — Z006 Encounter for examination for normal comparison and control in clinical research program: Secondary | ICD-10-CM

## 2024-04-22 ENCOUNTER — Other Ambulatory Visit: Payer: Self-pay

## 2024-04-22 DIAGNOSIS — I10 Essential (primary) hypertension: Secondary | ICD-10-CM

## 2024-04-22 NOTE — Telephone Encounter (Unsigned)
 Copied from CRM [id number]. Topic: Clinical - Medication Refill >> Apr 22, 2024 11:41 AM Rea C wrote: Medication: losartan -hydrochlorothiazide  (HYZAAR) 100-25 MG tablet  Has the patient contacted their pharmacy? Yes and was told to call clinic.  (Agent: If no, request that the patient contact the pharmacy for the refill. If patient does not wish to contact the pharmacy document the reason why and proceed with request.) (Agent: If yes, when and what did the pharmacy advise?)  This is the patient's preferred pharmacy:  [id number] [address] [facility] - [address] [place] [address] Phone: [phone] Fax: [phone]  Is this the correct pharmacy for this prescription? Yes If no, delete pharmacy and type the correct one.   Has the prescription been filled recently? Yes  Is the patient out of the medication? Yes, completely out  Has the patient been seen for an appointment in the last year OR does the patient have an upcoming appointment? Yes  Can we respond through MyChart? Yes  Agent: Please be advised that Rx refills may take up to 3 business days. We ask that you follow-up with your pharmacy.

## 2024-04-24 MED ORDER — LOSARTAN POTASSIUM-HCTZ 100-25 MG PO TABS: ORAL_TABLET | ORAL | 1 refills | Status: AC

## 2024-04-25 DIAGNOSIS — M9905 Segmental and somatic dysfunction of pelvic region: Secondary | ICD-10-CM | POA: Diagnosis not present

## 2024-04-25 DIAGNOSIS — M9903 Segmental and somatic dysfunction of lumbar region: Secondary | ICD-10-CM | POA: Diagnosis not present

## 2024-04-25 DIAGNOSIS — M62830 Muscle spasm of back: Secondary | ICD-10-CM | POA: Diagnosis not present

## 2024-04-25 DIAGNOSIS — M9902 Segmental and somatic dysfunction of thoracic region: Secondary | ICD-10-CM | POA: Diagnosis not present

## 2024-07-12 ENCOUNTER — Ambulatory Visit: Admitting: Nurse Practitioner

## 2024-07-12 ENCOUNTER — Encounter: Payer: Self-pay | Admitting: Nurse Practitioner

## 2024-07-12 VITALS — BP 116/84 | HR 68 | Temp 98.6°F | Ht 72.0 in | Wt 234.6 lb

## 2024-07-12 DIAGNOSIS — R2231 Localized swelling, mass and lump, right upper limb: Secondary | ICD-10-CM

## 2024-07-12 DIAGNOSIS — B002 Herpesviral gingivostomatitis and pharyngotonsillitis: Secondary | ICD-10-CM | POA: Diagnosis not present

## 2024-07-12 DIAGNOSIS — L308 Other specified dermatitis: Secondary | ICD-10-CM | POA: Diagnosis not present

## 2024-07-12 MED ORDER — TRIAMCINOLONE ACETONIDE 0.1 % EX CREA: 1.0000 | TOPICAL_CREAM | Freq: Two times a day (BID) | CUTANEOUS | 1 refills | Status: AC

## 2024-07-12 MED ORDER — ACYCLOVIR 5 % EX OINT: 1.0000 | TOPICAL_OINTMENT | Freq: Four times a day (QID) | CUTANEOUS | 3 refills | Status: AC | PRN

## 2024-07-12 NOTE — Progress Notes (Signed)
 "  Established Patient Office Visit  Subjective:  Patient ID: Jonathan Blackwell, male    DOB: [birth year]  Age: 64 y.o. MRN: [id number]  CC:  Chief Complaint  Patient presents with   Acute Visit    Knot on right knuckle has hardened and a little painful   Discussed the use of AI scribe software for clinical note transcription with the patient, who gave verbal consent to proceed.  History of Present Illness   Discussed the use of AI scribe software for clinical note transcription with the patient, who gave verbal consent to proceed.  History of Present Illness   Jonathan Blackwell is a 64 year old male who presents with stiffness and a hard knot in his finger.  For the past 1-2 months, he has had stiffness in one finger with a hard knot in the same area, which prevents him from fully closing the finger. He feels pressure and a pulling sensation when attempting to close his hand. Symptoms have persisted without improvement. Typing, which involves frequent finger bending for his computer-based job, worsens the discomfort, while playing the saxophone causes less discomfort. He has not tried any treatments such as hot or cold compresses. He reports that his mother had a similar problem with her pinky finger.  He has eczema that flares in winter bilaterally in the lower extremity and sometimes extends upward, causing itching and discomfort.  The rash improves in summer.  He is requesting refill for triamcinolone  cream.  He also intermittently gets fever blister around  perioral area.  He is requesting refill for Zovirax  ointment.  No rashes in the perioral area at present    Past Medical History:  Diagnosis Date   Acute non-recurrent maxillary sinusitis 05/25/2023   Allergic rhinitis due to pollen    Allergy 2000   Most Pain Medications   Cellulitis of left wrist 03/28/2022   COVID-19    01/06/20 + , x 2 total had covid   Fatty liver    HTN (hypertension)    Hyperlipidemia    ITP  (idiopathic thrombocytopenic purpura)    off prednisone  since 02/2010, ?etiology per pt 2/2 antibiotics     Past Surgical History:  Procedure Laterality Date   HYDROCELE EXCISION Right 04/26/2021   Procedure: HYDROCELECTOMY ADULT;  Surgeon: Penne Knee, MD;  Location: [facility] ORS;  Service: Urology;  Laterality: Right;   NASAL SINUS SURGERY      Family History  Problem Relation Age of Onset   Arthritis Other    Obesity Mother    Hypertension Father    Heart disease Father     Social History   Socioeconomic History   Marital status: Divorced    Spouse name: Not on file   Number of children: 3   Years of education: Not on file   Highest education level: Bachelor's degree (e.g., BA, AB, BS)  Occupational History   Occupation: transport planner, retired as of 2019    Employer: [employer]  Tobacco Use   Smoking status: Never   Smokeless tobacco: Never  Vaping Use   Vaping status: Never Used  Substance and Sexual Activity   Alcohol use: Yes    Comment: occ wine   Drug use: No   Sexual activity: Not on file  Other Topics Concern   Not on file  Social History Narrative   Married with kids divorced as of 03/2020, 30+ years marriage    Retired Chartered loss adjuster works [place]   3 kids as of  03/2020 2 girls and 1 boy ages 76, 93, 13    Dad lives with him    Social Drivers of Health   Tobacco Use: Low Risk (07/12/2024)   Patient History    Smoking Tobacco Use: Never    Smokeless Tobacco Use: Never    Passive Exposure: Not on file  Financial Resource Strain: Low Risk (08/21/2023)   Overall Financial Resource Strain (CARDIA)    Difficulty of Paying Living Expenses: Not very hard  Food Insecurity: No Food Insecurity (08/21/2023)   Hunger Vital Sign    Worried About Running Out of Food in the Last Year: Never true    Ran Out of Food in the Last Year: Never true  Transportation Needs: No Transportation Needs (08/21/2023)   PRAPARE - Administrator, Civil Service  (Medical): No    Lack of Transportation (Non-Medical): No  Physical Activity: Sufficiently Active (08/21/2023)   Exercise Vital Sign    Days of Exercise per Week: 3 days    Minutes of Exercise per Session: 60 min  Stress: No Stress Concern Present (08/21/2023)   [employer] of Occupational Health - Occupational Stress Questionnaire    Feeling of Stress : Only a little  Social Connections: Moderately Integrated (08/21/2023)   Social Connection and Isolation Panel    Frequency of Communication with Friends and Family: More than three times a week    Frequency of Social Gatherings with Friends and Family: Three times a week    Attends Religious Services: More than 4 times per year    Active Member of Clubs or Organizations: No    Attends Banker Meetings: Not on file    Marital Status: Living with partner  Intimate Partner Violence: Not on file  Depression (PHQ2-9): Low Risk (07/12/2024)   Depression (PHQ2-9)    PHQ-2 Score: 0  Alcohol Screen: Low Risk (08/21/2023)   Alcohol Screen    Last Alcohol Screening Score (AUDIT): 2  Housing: Unknown (08/21/2023)   Housing Stability Vital Sign    Unable to Pay for Housing in the Last Year: No    Number of Times Moved in the Last Year: Not on file    Homeless in the Last Year: No  Utilities: Not on file  Health Literacy: Not on file     Outpatient Medications Prior to Visit  Medication Sig Dispense Refill   losartan -hydrochlorothiazide  (HYZAAR) 100-25 MG tablet TAKE 1 TABLET BY MOUTH EVERY DAY IN THE MORNING 90 tablet 1   Multiple Vitamin (MULTIVITAMIN ADULT PO) Take by mouth.     acyclovir  ointment (ZOVIRAX ) 5 % Apply 1 Application topically every 6 (six) hours as needed. 30 g 11   methylPREDNISolone  (MEDROL  DOSEPAK) 4 MG TBPK tablet Take as directed. 21 each 0   No facility-administered medications prior to visit.    Allergies[1]  ROS Review of Systems Negative unless indicated in HPI.    Objective:    Physical  Exam Constitutional:      Appearance: Normal appearance.  HENT:     Mouth/Throat:     Mouth: Mucous membranes are moist.  Cardiovascular:     Rate and Rhythm: Normal rate and regular rhythm.     Pulses: Normal pulses.     Heart sounds: Normal heart sounds.  Pulmonary:     Effort: Pulmonary effort is normal.     Breath sounds: Normal breath sounds.  Musculoskeletal:     Cervical back: Normal range of motion. No tenderness.  Comments:  lump on the right index finger, tender with movement.  Skin:    General: Skin is warm.     Findings: Rash (Bilateral lower extremity) present.  Neurological:     General: No focal deficit present.     Mental Status: He is alert and oriented to person, place, and time. Mental status is at baseline.  Psychiatric:        Mood and Affect: Mood normal.        Behavior: Behavior normal.        Thought Content: Thought content normal.     BP 116/84   Pulse 68   Temp 98.6 F (37 C)   Ht 6' (1.829 m)   Wt 234 lb 9.6 oz (106.4 kg)   SpO2 99%   BMI 31.82 kg/m  Wt Readings from Last 3 Encounters:  07/12/24 234 lb 9.6 oz (106.4 kg)  02/27/24 232 lb 8 oz (105.5 kg)  10/25/23 225 lb 9.6 oz (102.3 kg)     Health Maintenance  Topic Date Due   Colonoscopy  Never done   Pneumococcal Vaccine: 50+ Years (1 of 1 - PCV) Never done   Influenza Vaccine  09/17/2024 (Originally 01/19/2024)   DTaP/Tdap/Td (2 - Td or Tdap) 09/29/2027   HPV VACCINES (No Doses Required) Completed   Hepatitis C Screening  Completed   HIV Screening  Completed   Hepatitis B Vaccines 19-59 Average Risk  Aged Out   Meningococcal B Vaccine  Aged Out   COVID-19 Vaccine  Discontinued   Zoster Vaccines- Shingrix  Discontinued    There are no preventive care reminders to display for this patient.  Lab Results  Component Value Date   TSH 2.88 09/26/2023   Lab Results  Component Value Date   WBC 5.3 09/26/2023   HGB 15.1 09/26/2023   HCT 45.3 09/26/2023   MCV 92.0 09/26/2023    PLT 164.0 09/26/2023   Lab Results  Component Value Date   NA 140 09/26/2023   K 3.7 09/26/2023   CO2 28 09/26/2023   GLUCOSE 97 09/26/2023   BUN 14 09/26/2023   CREATININE 0.97 09/26/2023   BILITOT 0.9 09/26/2023   ALKPHOS 63 09/26/2023   AST 17 09/26/2023   ALT 29 09/26/2023   PROT 7.1 09/26/2023   ALBUMIN 4.6 09/26/2023   CALCIUM 9.4 09/26/2023   ANIONGAP 8 04/23/2021   GFR 84.24 09/26/2023   Lab Results  Component Value Date   CHOL 210 (H) 09/26/2023   Lab Results  Component Value Date   HDL 44.60 09/26/2023   Lab Results  Component Value Date   LDLCALC 146 (H) 09/26/2023   Lab Results  Component Value Date   TRIG 97.0 09/26/2023   Lab Results  Component Value Date   CHOLHDL 5 09/26/2023   Lab Results  Component Value Date   HGBA1C 5.6 09/26/2023      Assessment & Plan:   Assessment & Plan Other eczema Recurrent eczema exacerbated by winter weather particularly in the lower extremity. Managed with moisturizers and topical corticosteroids. - Prescribed triamcinolone  cream. - Advised regular use of moisturizers.    Oral herpes History of recurrent oral herpes with fever blisters.  No fever blister present.  Patient requested refill of Zovirax  ointment - Prescribed Zovirax .  Orders:   acyclovir  ointment (ZOVIRAX ) 5 %; Apply 1 Application topically every 6 (six) hours as needed.  Mass of finger of right hand Stiffness and inability to fully close the right index finger with a  hard knot and pressure sensation. Pain worsens with certain movements. Differential includes bony abnormalities or tendon issues. - Ordered x-ray of the right hand. - Referred to hand surgeon. - Recommended night use of finger splints Orders:   DG Hand Complete Right; Future   Ambulatory referral to Orthopedic Surgery   Assessment and Plan Assessment & Plan   Follow-up: Return if symptoms worsen or fail to improve.   Cyncere Sontag, NP     [1]   Allergies Allergen Reactions   Hydrocodone-Chlorpheniramine Itching   "

## 2024-07-17 ENCOUNTER — Ambulatory Visit

## 2024-07-18 DIAGNOSIS — R2231 Localized swelling, mass and lump, right upper limb: Secondary | ICD-10-CM | POA: Insufficient documentation

## 2024-07-18 DIAGNOSIS — L309 Dermatitis, unspecified: Secondary | ICD-10-CM | POA: Insufficient documentation

## 2024-07-18 NOTE — Assessment & Plan Note (Signed)
 History of recurrent oral herpes with fever blisters.  No fever blister present.  Patient requested refill of Zovirax  ointment - Prescribed Zovirax .  Orders:   acyclovir  ointment (ZOVIRAX ) 5 %; Apply 1 Application topically every 6 (six) hours as needed.

## 2024-07-18 NOTE — Assessment & Plan Note (Signed)
 Stiffness and inability to fully close the right index finger with a hard knot and pressure sensation. Pain worsens with certain movements. Differential includes bony abnormalities or tendon issues. - Ordered x-ray of the right hand. - Referred to hand surgeon. - Recommended night use of finger splints Orders:   DG Hand Complete Right; Future   Ambulatory referral to Orthopedic Surgery

## 2024-07-18 NOTE — Assessment & Plan Note (Addendum)
 Recurrent eczema exacerbated by winter weather particularly in the lower extremity. Managed with moisturizers and topical corticosteroids. - Prescribed triamcinolone  cream. - Advised regular use of moisturizers.
